# Patient Record
Sex: Male | Born: 1975 | Race: White | Hispanic: No | Marital: Single | Smoking: Current every day smoker
Health system: Southern US, Community
[De-identification: ages and names within clinical notes are randomized; demographics above are authoritative.]

## PROBLEM LIST (undated history)

## (undated) ENCOUNTER — Encounter

## (undated) ENCOUNTER — Telehealth

## (undated) ENCOUNTER — Inpatient Hospital Stay

## (undated) ENCOUNTER — Ambulatory Visit

## (undated) ENCOUNTER — Ambulatory Visit: Payer: Medicaid (Managed Care)

## (undated) ENCOUNTER — Telehealth
Attending: Pharmacist Clinician (PhC)/ Clinical Pharmacy Specialist | Primary: Pharmacist Clinician (PhC)/ Clinical Pharmacy Specialist

## (undated) ENCOUNTER — Ambulatory Visit: Payer: MEDICAID

## (undated) DIAGNOSIS — F1990 Other psychoactive substance use, unspecified, uncomplicated: Secondary | ICD-10-CM

## (undated) DIAGNOSIS — B1920 Unspecified viral hepatitis C without hepatic coma: Secondary | ICD-10-CM

## (undated) HISTORY — PX: ABDOMINAL SURGERY: SHX537

## (undated) HISTORY — PX: HERNIA REPAIR: SHX51

---

## 2006-03-01 ENCOUNTER — Emergency Department: Payer: Self-pay | Admitting: Emergency Medicine

## 2006-03-01 ENCOUNTER — Other Ambulatory Visit: Payer: Self-pay

## 2007-01-25 ENCOUNTER — Emergency Department: Payer: Self-pay

## 2008-06-18 ENCOUNTER — Emergency Department: Payer: Self-pay | Admitting: Emergency Medicine

## 2008-07-28 ENCOUNTER — Inpatient Hospital Stay: Payer: Self-pay | Admitting: Psychiatry

## 2008-10-29 ENCOUNTER — Emergency Department: Payer: Self-pay | Admitting: Emergency Medicine

## 2008-12-21 ENCOUNTER — Emergency Department: Payer: Self-pay | Admitting: Emergency Medicine

## 2009-01-09 ENCOUNTER — Emergency Department: Payer: Self-pay | Admitting: Emergency Medicine

## 2011-02-17 ENCOUNTER — Emergency Department: Payer: Self-pay | Admitting: Emergency Medicine

## 2011-04-14 ENCOUNTER — Emergency Department: Payer: Self-pay | Admitting: Emergency Medicine

## 2011-04-17 ENCOUNTER — Emergency Department: Payer: Self-pay | Admitting: Internal Medicine

## 2011-10-23 ENCOUNTER — Emergency Department: Payer: Self-pay | Admitting: *Deleted

## 2011-10-28 LAB — WOUND CULTURE

## 2012-01-02 ENCOUNTER — Emergency Department: Payer: Self-pay | Admitting: Emergency Medicine

## 2012-07-16 ENCOUNTER — Inpatient Hospital Stay: Payer: Self-pay | Admitting: Internal Medicine

## 2012-07-16 LAB — CBC
HCT: 44.4 % (ref 40.0–52.0)
HGB: 14.8 g/dL (ref 13.0–18.0)
MCH: 30.1 pg (ref 26.0–34.0)
MCV: 91 fL (ref 80–100)
Platelet: 182 10*3/uL (ref 150–440)
RDW: 13.8 % (ref 11.5–14.5)
WBC: 19.6 10*3/uL — ABNORMAL HIGH (ref 3.8–10.6)

## 2012-07-16 LAB — COMPREHENSIVE METABOLIC PANEL
Anion Gap: 3 — ABNORMAL LOW (ref 7–16)
BUN: 12 mg/dL (ref 7–18)
Bilirubin,Total: 0.6 mg/dL (ref 0.2–1.0)
Calcium, Total: 8.3 mg/dL — ABNORMAL LOW (ref 8.5–10.1)
Chloride: 105 mmol/L (ref 98–107)
Co2: 30 mmol/L (ref 21–32)
Creatinine: 1.04 mg/dL (ref 0.60–1.30)
EGFR (African American): >60
EGFR (Non-African Amer.): >60
Glucose: 138 mg/dL — ABNORMAL HIGH (ref 65–99)
Potassium: 3.9 mmol/L (ref 3.5–5.1)
SGOT(AST): 56 U/L — ABNORMAL HIGH (ref 15–37)
SGPT (ALT): 147 U/L — ABNORMAL HIGH (ref 12–78)
Sodium: 138 mmol/L (ref 136–145)
Total Protein: 8.2 g/dL (ref 6.4–8.2)

## 2012-07-16 LAB — ACETAMINOPHEN LEVEL: Acetaminophen: <2 ug/mL

## 2012-07-17 LAB — CBC WITH DIFFERENTIAL/PLATELET
Eosinophil #: 0.1 10*3/uL (ref 0.0–0.7)
HGB: 13.7 g/dL (ref 13.0–18.0)
Lymphocyte #: 3.3 10*3/uL (ref 1.0–3.6)
Lymphocyte %: 37 %
MCH: 31.4 pg (ref 26.0–34.0)
MCHC: 34.9 g/dL (ref 32.0–36.0)
Monocyte #: 1 x10 3/mm (ref 0.2–1.0)
Monocyte %: 11.1 %
Neutrophil #: 4.5 10*3/uL (ref 1.4–6.5)
Neutrophil %: 50.7 %
Platelet: 171 10*3/uL (ref 150–440)
RDW: 13.6 % (ref 11.5–14.5)
WBC: 9 10*3/uL (ref 3.8–10.6)

## 2012-07-22 LAB — CULTURE, BLOOD (SINGLE)

## 2012-08-05 ENCOUNTER — Emergency Department: Payer: Self-pay | Admitting: Internal Medicine

## 2012-10-13 ENCOUNTER — Emergency Department: Payer: Self-pay | Admitting: Emergency Medicine

## 2014-08-17 ENCOUNTER — Emergency Department: Payer: Self-pay | Admitting: Emergency Medicine

## 2014-10-14 NOTE — Discharge Summary (Signed)
PATIENT NAME:  Gregory Collier, Gregory Collier MR#:  [id number] DATE OF BIRTH:  [birth year]  DATE OF ADMISSION:  07/16/2012 DATE OF DISCHARGE:  07/17/2012  PRIMARY CARE PHYSICIAN: None.   DISCHARGE DIAGNOSES:  1.  Right lower lobe aspiration pneumonia.  2.  Acute respiratory failure.  3.  Percocet overdose for pain.   IMAGING STUDIES: Include a chest x-ray which showed right lower lobe pneumonia concerning for aspiration.   ADMITTING HISTORY AND PHYSICAL: Please see detailed H and P dictated by Dr. Juliene PinaMody on 07/16/2012. In brief, this 39 year old male patient with history of tobacco dependence, alcohol abuse, presented to the Emergency Room being unresponsive. The patient was found by EMS, responded to Narcan in the Emergency Room, was supposed to be discharged home but had acute respiratory failure with hypoxemia and was admitted to the hospitalist service after chest x-ray showed right lower lobe pneumonia.   HOSPITAL COURSE:  1.  Right lower lobe aspiration pneumonia. The patient was started on Zosyn while in the hospital and on the day of discharge transitioned to Augmentin at home for 6 more days. His saturations were 86% on room air at the time of discharge.  2.  Narcotic overdose. The patient had chronic pain in neck and back, for which he took extra 4 to 5 Percocet, did not have any depression or suicidal ideation.   DISCHARGE CONDITION: His lungs sound clear on the day of discharge with normal blood pressure and heart rate, and he is being discharged home in fair condition.   DISCHARGE MEDICATIONS: Include:  1.  Augmentin 875, 1 tablet oral 2 times a day for 6 days. 2.  Acetaminophen 650 mg oral every 4 hours as needed for pain and fever.   DISCHARGE INSTRUCTIONS: Regular diet. No restriction on activity. Follow up with primary care physician in a week. I have advised the patient to take medications only as prescribed to prevent further overdose.   TIME SPENT: On day of discharge in discharge  activity was 45 minutes.    ____________________________ Gregory BailiffSrikar R. Joquan Lotz, MD srs:jm D: 07/17/2012 12:50:04 ET Collier: 07/17/2012 15:11:57 ET JOB#: [id number]  cc: Wardell HeathSrikar R. Gina Costilla, MD, <Dictator> Orie FishermanSRIKAR R Chloey Ricard MD ELECTRONICALLY SIGNED 07/30/2012 13:21

## 2014-10-14 NOTE — H&P (Signed)
PATIENT NAME:  Gregory Collier, Gregory Collier MR#:  [id number] DATE OF BIRTH:  [birth year]  DATE OF ADMISSION:  07/16/2012  PRIMARY CARE PHYSICIAN: None.   CHIEF COMPLAINT: Overdose, accidental.   HISTORY OF PRESENT ILLNESS: This is a 39 year old male with history of tobacco dependence and alcohol abuse, who presents with the above complaint. The patient was brought in via EMS for unresponsiveness.  His girlfriend called EMS because he was unresponsive on the floor. He says that he was drinking four 40s last night and then took 4 to 5 Percocet due to back pain. He denies any suicidal or homicidal ideations or depression. In the Emergency Room, he was given Narcan. He is actually back to his baseline and he was he was going to be discharged; however, when he took his oxygen off  he de-sated into 80s, so the hospitalist services consulted for admission for aspiration pneumonia as evidenced by his chest x-ray.   REVIEW OF SYSTEMS: CONSTITUTIONAL: Denies any fever, fatigue, or weakness.  EYES:  No blurry or double vision.  ENT: No ear pain or hearing loss.  RESPIRATORY:  He denies cough, wheezing, history of COPD.  CARDIOVASCULAR: No chest pain or palpitations. He did have some unresponsiveness.  GASTROINTESTINAL: No nausea, vomiting, diarrhea, abdominal pain, or ulcers.  GENITOURINARY:  No dysuria or hematuria.  ENDOCRINE: No thyroid problems or increased sweating. HEMATOLOGIC/LYMPHATIC:  No anemia, easy bruising.   SKIN:  No rash or lesions.   MUSCULOSKELETAL: He has pain occasionally, but no limited activity.  NEUROLOGIC: No history of CVA, TIA or seizures.   PSYCH: No history of anxiety.   PAST MEDICAL HISTORY: 1.  History of tobacco dependence.  2.  History of hepatitis C.   MEDICATIONS: None.   SOCIAL HISTORY: The patient smokes 3 packs a day and is not interested at all quitting. He drinks 4 or 5 times a month in which he binge drinks.  ALLERGIES: THE PATIENT SAYS HE IS ALLERGIC TO SOME  ANTIBIOTIC, BUT HE JUST DOES NOT KNOW.   FAMILY HISTORY: Back and foot problems.   PAST SURGICAL HISTORY:   1.  Pyloric stenosis.  2.  Hernia repair.   PHYSICAL EXAMINATION: VITAL SIGNS: Temperature 98.4, pulse 106, respirations 20, blood pressure 109/75 and 93% on 2 liters.  GENERAL: The patient is alert, oriented, not in acute distress.  HEENT: Head is atraumatic. Pupils are round and reactive. Sclerae anicteric. Mucous membranes are moist. Oropharynx is clear.  NECK: Supple without JVD or carotid bruit or enlarged thyroid.  CARDIOVASCULAR: Regular rate and rhythm. No murmurs, gallops, or rubs. PMI is not displaced.  LUNGS: Clear to auscultation. There are no obvious crackles, rales, rhonchi, or wheezing. No increased respiratory effort.  ABDOMEN: Bowel sounds are positive, nontender, nondistended. No hepatosplenomegaly.   EXTREMITIES: No clubbing, cyanosis, or edema.  NEUROLOGIC:  Cranial nerves 2 through 12 are intact. There are no focal deficits.  SKIN: Without rash or lesions.   LABORATORY DATA: White blood cells 19.6, hemoglobin 14.8, hematocrit 44.4, platelets 182, sodium 138, potassium 3.9, chloride 105, bicarbonate 30, BUN 12, creatinine 1.04, glucose 138, calcium 8.3, bilirubin 0.6, alkaline phosphatase 62, ALT 147, AST 56, total protein 8.2, albumin 4.3.  Chest x-ray shows right lung base pneumonia.   EKG is normal sinus rhythm. No ST elevations or depressions.   ASSESSMENT AND PLAN: A 39 year old male who took 4 to 5 Percocet while drinking a significant amount of alcohol, who presented with unresponsiveness and was found to have an aspiration pneumonia  with hypoxia.  1.  Acute respiratory failure with hypoxia, secondary to aspiration pneumonia. The patient will be treated for aspiration pneumonia with Zosyn. We will monitor closely as the patient says he is allergic to an antibiotic, but he does not know the reaction or what antibiotic he is allergic to. Blood cultures were  ordered in the Emergency Room; however, apparently there is a short supply/of blood cultures in the hospital, so I am not quite sure at this time if they are actually taken or not. We will follow up.  2.  Systemic inflammatory response syndrome, as evidenced by leukocytosis and tachycardia. We will monitor. This is all due to aspiration pneumonia.  3.  Overdose, accidental, unintentional. The patient will be monitored. The patient denies any suicidal ideations. At this time, I do not think we need a sitter or psychiatric consult.  4.  Tobacco dependence. The patient is absolutely adamant about not quitting smoking. We will place a nicotine patch.   CODE STATUS:  The patient is full CODE STATUS.   The patient will need a primary care physician prior to discharge.   TIME SPENT: Approximately 35 minutes.    ____________________________ Janyth Contes. Juliene Pina, MD spm:cc D: 07/16/2012 17:22:24 ET Collier: 07/16/2012 18:04:40 ET JOB#: [id number]  cc: Glenora Morocho P. Juliene Pina, MD, <Dictator> Janyth Contes Odarius Dines MD ELECTRONICALLY SIGNED 07/17/2012 13:47

## 2015-04-22 ENCOUNTER — Emergency Department
Admission: EM | Admit: 2015-04-22 | Discharge: 2015-04-23 | Disposition: A | Discharge status: Home / Self Care | Payer: Self-pay | Attending: Emergency Medicine | Admitting: Emergency Medicine

## 2015-04-22 ENCOUNTER — Encounter: Payer: Self-pay | Admitting: Emergency Medicine

## 2015-04-22 DIAGNOSIS — M546 Pain in thoracic spine: Secondary | ICD-10-CM | POA: Insufficient documentation

## 2015-04-22 DIAGNOSIS — M545 Low back pain, unspecified: Secondary | ICD-10-CM

## 2015-04-22 DIAGNOSIS — F17210 Nicotine dependence, cigarettes, uncomplicated: Secondary | ICD-10-CM | POA: Insufficient documentation

## 2015-04-22 DIAGNOSIS — R110 Nausea: Secondary | ICD-10-CM | POA: Insufficient documentation

## 2015-04-22 DIAGNOSIS — R509 Fever, unspecified: Secondary | ICD-10-CM | POA: Insufficient documentation

## 2015-04-22 DIAGNOSIS — R109 Unspecified abdominal pain: Secondary | ICD-10-CM | POA: Insufficient documentation

## 2015-04-22 DIAGNOSIS — R10A1 Flank pain, right side: Secondary | ICD-10-CM

## 2015-04-22 LAB — URINALYSIS COMPLETE WITH MICROSCOPIC (ARMC ONLY)
BILIRUBIN URINE: NEGATIVE
Bacteria, UA: NONE SEEN
GLUCOSE, UA: NEGATIVE mg/dL
Hgb urine dipstick: NEGATIVE
KETONES UR: NEGATIVE mg/dL
LEUKOCYTES UA: NEGATIVE
NITRITE: NEGATIVE
PH: 6 (ref 5.0–8.0)
Protein, ur: NEGATIVE mg/dL
Specific Gravity, Urine: 1.021 (ref 1.005–1.030)
Squamous Epithelial / LPF: NONE SEEN

## 2015-04-22 NOTE — ED Notes (Signed)
Pt c/o right low back pain for 3 weeks; denies injury but pain is worse with movement; says urine is dark but denies any urinary s/s;

## 2015-04-23 ENCOUNTER — Emergency Department: Payer: Self-pay

## 2015-04-23 LAB — CBC WITH DIFFERENTIAL/PLATELET
BASOS ABS: 0 10*3/uL (ref 0–0.1)
BASOS PCT: 1 %
EOS PCT: 11 %
Eosinophils Absolute: 0.8 10*3/uL — ABNORMAL HIGH (ref 0–0.7)
HEMATOCRIT: 40.6 % (ref 40.0–52.0)
Hemoglobin: 13.6 g/dL (ref 13.0–18.0)
Lymphocytes Relative: 39 %
Lymphs Abs: 2.9 10*3/uL (ref 1.0–3.6)
MCH: 30.6 pg (ref 26.0–34.0)
MCHC: 33.6 g/dL (ref 32.0–36.0)
MCV: 91.1 fL (ref 80.0–100.0)
MONO ABS: 0.7 10*3/uL (ref 0.2–1.0)
MONOS PCT: 9 %
NEUTROS ABS: 3 10*3/uL (ref 1.4–6.5)
Neutrophils Relative %: 40 %
PLATELETS: 211 10*3/uL (ref 150–440)
RBC: 4.45 MIL/uL (ref 4.40–5.90)
RDW: 12.7 % (ref 11.5–14.5)
WBC: 7.4 10*3/uL (ref 3.8–10.6)

## 2015-04-23 LAB — COMPREHENSIVE METABOLIC PANEL
ALBUMIN: 3.5 g/dL (ref 3.5–5.0)
ALT: 47 U/L (ref 17–63)
ANION GAP: 7 (ref 5–15)
AST: 34 U/L (ref 15–41)
Alkaline Phosphatase: 33 U/L — ABNORMAL LOW (ref 38–126)
BILIRUBIN TOTAL: 0.1 mg/dL — AB (ref 0.3–1.2)
BUN: 21 mg/dL — ABNORMAL HIGH (ref 6–20)
CALCIUM: 8.9 mg/dL (ref 8.9–10.3)
CHLORIDE: 105 mmol/L (ref 101–111)
CO2: 26 mmol/L (ref 22–32)
Creatinine, Ser: 1.02 mg/dL (ref 0.61–1.24)
GFR calc Af Amer: >60 mL/min (ref >60)
GLUCOSE: 99 mg/dL (ref 65–99)
POTASSIUM: 3.9 mmol/L (ref 3.5–5.1)
Sodium: 138 mmol/L (ref 135–145)
Total Protein: 6.4 g/dL — ABNORMAL LOW (ref 6.5–8.1)

## 2015-04-23 LAB — LIPASE, BLOOD: LIPASE: 27 U/L (ref 11–51)

## 2015-04-23 MED ORDER — ONDANSETRON HCL 4 MG/2ML IJ SOLN
4.0000 mg | Freq: Once | INTRAMUSCULAR | Status: AC
  Administered 2015-04-23: 4 mg via INTRAVENOUS
  Filled 2015-04-23: qty 2

## 2015-04-23 MED ORDER — CYCLOBENZAPRINE HCL 10 MG PO TABS: 10.0000 mg | ORAL_TABLET | Freq: Three times a day (TID) | ORAL | Status: DC | PRN

## 2015-04-23 MED ORDER — CARISOPRODOL 350 MG PO TABS
350.0000 mg | ORAL_TABLET | Freq: Three times a day (TID) | ORAL | Status: AC | PRN
Start: 2015-04-23 — End: ?

## 2015-04-23 MED ORDER — MORPHINE SULFATE (PF) 4 MG/ML IV SOLN
4.0000 mg | Freq: Once | INTRAVENOUS | Status: AC
  Administered 2015-04-23: 4 mg via INTRAVENOUS
  Filled 2015-04-23: qty 1

## 2015-04-23 MED ORDER — SODIUM CHLORIDE 0.9 % IV BOLUS (SEPSIS)
1000.0000 mL | Freq: Once | INTRAVENOUS | Status: AC
  Administered 2015-04-23: 1000 mL via INTRAVENOUS

## 2015-04-23 NOTE — ED Notes (Signed)
MD Scheavitz at bedside. 

## 2015-04-23 NOTE — ED Provider Notes (Signed)
[facility] Emergency Department Provider Note  ____________________________________________  Time seen: Approximately 1225 AM  I have reviewed the triage vital signs and the nursing notes.   HISTORY  Chief Complaint Back Pain    HPI Gregory Collier is a 39 y.o. male without any pertinent medical history was presenting today with right flank and right back pain which is in the thoracic as well as lumbar region. He says it has been constant over the past 3 weeks. He says that it is associated with subjective fever as well as nausea. He says that his urine has been dark. He denies any burning or frequency. He describes the pain as sharp. He has tried ibuprofen at home without relief.Denies any drug use. Denies any weakness to lower extremities. Says that he works in [employer] and does heavy lifting but has been doing this job for 20 years now.   History reviewed. No pertinent past medical history.  There are no active problems to display for this patient.   Past Surgical History  Procedure Laterality Date  . Hernia repair    . Abdominal surgery      No current outpatient prescriptions on file.  Allergies Review of patient's allergies indicates no known allergies.  History reviewed. No pertinent family history.  Social History Social History  Substance Use Topics  . Smoking status: Current Every Day Smoker -- 2.00 packs/day for 20 years    Types: Cigarettes  . Smokeless tobacco: Never Used  . Alcohol Use: No     Comment: 1 beer a month    Review of Systems Constitutional: Subjective fever Eyes: No visual changes. ENT: No sore throat. Cardiovascular: Denies chest pain. Respiratory: Denies shortness of breath. Gastrointestinal: No abdominal pain.   no vomiting.  No diarrhea.  No constipation. Genitourinary: Negative for dysuria. Musculoskeletal: As above  Skin: Negative for rash. Neurological: Negative for headaches, focal weakness or  numbness.  10-point ROS otherwise negative.  ____________________________________________   PHYSICAL EXAM:  VITAL SIGNS: ED Triage Vitals  Enc Vitals Group     BP 04/22/15 2147 119/66 mmHg     Pulse Rate 04/22/15 2147 60     Resp 04/22/15 2147 18     Temp 04/22/15 2147 98.4 F (36.9 C)     Temp Source 04/22/15 2147 Oral     SpO2 04/22/15 2147 96 %     Weight 04/22/15 2147 165 lb (74.844 kg)     Height 04/22/15 2147 5\' 10"  (1.778 m)     Head Cir --      Peak Flow --      Pain Score 04/22/15 2147 8     Pain Loc --      Pain Edu? --      Excl. in GC? --     Constitutional: Alert and oriented. Well appearing and in no acute distress. Eyes: Conjunctivae are normal. PERRL. EOMI. Head: Atraumatic. Nose: No congestion/rhinnorhea. Mouth/Throat: Mucous membranes are moist.  Oropharynx non-erythematous. Neck: No stridor.   Cardiovascular: Normal rate, regular rhythm. Grossly normal heart sounds.  Good peripheral circulation. Respiratory: Normal respiratory effort.  No retractions. Lungs CTAB. Gastrointestinal: Soft and nontender. No distention. No abdominal bruits. Right-sided CVA and right lumbar tenderness to palpation. There is no midline spinal tenderness, step-off or deformity. Musculoskeletal: No lower extremity tenderness nor edema.  No joint effusions. Neurologic:  Normal speech and language. No gross focal neurologic deficits are appreciated. No gait instability. Skin:  Skin is warm, dry and intact. No  rash noted. Psychiatric: Mood and affect are normal. Speech and behavior are normal.  ____________________________________________   LABS (all labs ordered are listed, but only abnormal results are displayed)  Labs Reviewed  URINALYSIS COMPLETEWITH MICROSCOPIC ([facility] ONLY) - Abnormal; Notable for the following:    Color, Urine YELLOW (*)    APPearance CLEAR (*)    All other components within normal limits  CBC WITH DIFFERENTIAL/PLATELET - Abnormal; Notable for the  following:    Eosinophils Absolute 0.8 (*)    All other components within normal limits  COMPREHENSIVE METABOLIC PANEL - Abnormal; Notable for the following:    BUN 21 (*)    Total Protein 6.4 (*)    Alkaline Phosphatase 33 (*)    Total Bilirubin 0.1 (*)    All other components within normal limits  LIPASE, BLOOD   ____________________________________________  EKG   ____________________________________________  RADIOLOGY  No cause for the patient's pain found on the cat scan of the abdomen and pelvis. ____________________________________________   PROCEDURES  ____________________________________________   INITIAL IMPRESSION / ASSESSMENT AND PLAN / ED COURSE  Pertinent labs & imaging results that were available during my care of the patient were reviewed by me and considered in my medical decision making (see chart for details).  ----------------------------------------- 2:53 AM on 04/23/2015 -----------------------------------------  Patient is resting comfortably at this time. Updated the patient as well as his family member about the results of the blood work as well as the CAT scan. Possibly musculoskeletal pain does not appear to be an emergent condition requiring further workup or treatment in the emergency department at this time. Patient says he has been using Aspercreme and I see hot at home. Has a heating pad at home will try that as well. We'll continue to use ibuprofen. We'll also discharge with a muscle relaxer. Will give follow-up with primary care doctor that the patient may call to schedule follow-up appointment. ____________________________________________   FINAL CLINICAL IMPRESSION(S) / ED DIAGNOSES  Final diagnoses:  Right flank pain      Myrna Blazer, MD 04/23/15 [phone]

## 2015-04-23 NOTE — ED Notes (Signed)
Pt returned from ct at this time

## 2015-04-23 NOTE — ED Notes (Signed)
Pt ambulatory to bathroom at this time with no concerns. Pt in no acute distress. Vitals wnl.

## 2015-04-23 NOTE — ED Notes (Signed)
Pt requesting to "go out outside to get something from my car" Pt made aware that cannot leave due to having IV in arm. Pt stating "i will be right back" Pt further made aware that cannot go outside with IV. Pt went back into room at this time, laying in bed, no acute distress noted.

## 2015-04-23 NOTE — ED Notes (Signed)
Patient transported to CT 

## 2016-01-17 ENCOUNTER — Emergency Department (HOSPITAL_COMMUNITY)
Admission: EM | Admit: 2016-01-17 | Discharge: 2016-01-17 | Disposition: A | Discharge status: Home / Self Care | Payer: Medicaid Other | Attending: Emergency Medicine | Admitting: Emergency Medicine

## 2016-01-17 ENCOUNTER — Encounter (HOSPITAL_COMMUNITY): Payer: Self-pay | Admitting: Emergency Medicine

## 2016-01-17 DIAGNOSIS — L0231 Cutaneous abscess of buttock: Secondary | ICD-10-CM | POA: Diagnosis not present

## 2016-01-17 DIAGNOSIS — L0291 Cutaneous abscess, unspecified: Secondary | ICD-10-CM

## 2016-01-17 DIAGNOSIS — Z79899 Other long term (current) drug therapy: Secondary | ICD-10-CM | POA: Diagnosis not present

## 2016-01-17 DIAGNOSIS — L02415 Cutaneous abscess of right lower limb: Secondary | ICD-10-CM | POA: Insufficient documentation

## 2016-01-17 DIAGNOSIS — L0390 Cellulitis, unspecified: Secondary | ICD-10-CM

## 2016-01-17 DIAGNOSIS — F17210 Nicotine dependence, cigarettes, uncomplicated: Secondary | ICD-10-CM | POA: Insufficient documentation

## 2016-01-17 HISTORY — DX: Unspecified viral hepatitis C without hepatic coma: B19.20

## 2016-01-17 MED ORDER — SULFAMETHOXAZOLE-TRIMETHOPRIM 800-160 MG PO TABS: 1.0000 | ORAL_TABLET | Freq: Two times a day (BID) | ORAL | 0 refills | Status: AC

## 2016-01-17 MED ORDER — MELOXICAM 15 MG PO TABS: 15.0000 mg | ORAL_TABLET | Freq: Every day | ORAL | 0 refills | Status: AC

## 2016-01-17 MED ORDER — LIDOCAINE-EPINEPHRINE (PF) 2 %-1:200000 IJ SOLN
10.0000 mL | Freq: Once | INTRAMUSCULAR | Status: AC
  Administered 2016-01-17: 10 mL via INTRADERMAL
  Filled 2016-01-17: qty 20

## 2016-01-17 MED ORDER — KETOROLAC TROMETHAMINE 60 MG/2ML IM SOLN
60.0000 mg | Freq: Once | INTRAMUSCULAR | Status: AC
  Administered 2016-01-17: 60 mg via INTRAMUSCULAR
  Filled 2016-01-17: qty 2

## 2016-01-17 NOTE — ED Notes (Signed)
Pt verbalized understanding of d/c instructions and has no further questions. Pt stable and NAD. Pt provided with bus pass.

## 2016-01-17 NOTE — ED Triage Notes (Signed)
Pt. reports multiple skin abscesses at left buttocks  And bilateral knee with drainage onset 2 weeks ago.

## 2016-01-17 NOTE — ED Provider Notes (Signed)
[facility]-EMERGENCY DEPT Provider Note   CSN: [id number] Arrival date & time: 01/17/16  0027  First Provider Contact:  None       History   Chief Complaint Chief Complaint  Patient presents with  . Abscess    HPI Gregory Collier is a 40 y.o. male.  40 year old male who presents with multiple abscesses. PMH significant for abscess formation, past IVDU, Hep C. He reports last time he used IV drugs was 8 months ago. He states the abscesses have been forming over the past 2 weeks. He believes he was bit by a spider however did not actually see a spider. The first one appeared behind his right knee which he opened himself with a razor and took old antibiotics from when he had a previous abscess. He developed another abscess on his right anterior thigh just above his knee and left buttock which he also drained himself today and reports a copious amount of pus was drained. He also has a swollen, red, painful left knee which he tried to drain but no fluid came out. Reports associated difficulty walking and sitting due to pain. He denies fever.       Past Medical History:  Diagnosis Date  . Hepatitis C     There are no active problems to display for this patient.   Past Surgical History:  Procedure Laterality Date  . ABDOMINAL SURGERY    . HERNIA REPAIR         Home Medications    Prior to Admission medications   Medication Sig Start Date End Date Taking? Authorizing Provider  carisoprodol (SOMA) 350 MG tablet Take 1 tablet (350 mg total) by mouth 3 (three) times daily as needed for muscle spasms. 04/23/15   Myrna Blazer, MD  meloxicam (MOBIC) 15 MG tablet Take 1 tablet (15 mg total) by mouth daily. 01/17/16   Bethel Born, PA-C  sulfamethoxazole-trimethoprim (BACTRIM DS,SEPTRA DS) 800-160 MG tablet Take 1 tablet by mouth 2 (two) times daily. 01/17/16 01/24/16  Bethel Born, PA-C    Family History No family history on file.  Social History Social History    Substance Use Topics  . Smoking status: Current Every Day Smoker    Packs/day: 0.00    Years: 0.00    Types: Cigarettes  . Smokeless tobacco: Never Used  . Alcohol use No     Allergies   Review of patient's allergies indicates no known allergies.   Review of Systems Review of Systems  Constitutional: Negative for fever.  Musculoskeletal: Positive for gait problem and joint swelling.  Skin:       Abscesses     Physical Exam Updated Vital Signs BP 149/90 (BP Location: Left Arm)   Pulse 92   Temp 99.4 F (37.4 C) (Oral)   Resp 16   Ht 5\' 10"  (1.778 m)   Wt 67.6 kg   SpO2 98%   BMI 21.38 kg/m   Physical Exam  Constitutional: He is oriented to person, place, and time. He appears well-developed and well-nourished.  HENT:  Head: Normocephalic and atraumatic.  Eyes: Conjunctivae are normal.  Neck: Neck supple.  Cardiovascular: Normal rate and regular rhythm.   No murmur heard. Pulmonary/Chest: Effort normal and breath sounds normal. No respiratory distress.  Abdominal: Soft. There is no tenderness.  Musculoskeletal: He exhibits no edema.  Right knee: There is obvious swelling and redness. No deformity. Moderate to severe tenderness to palpation of anterior knee. Decreased flexion due to pain  and swelling. Normal extension. US shows diffuse cellulitis but not evidence of fluid collection   Neurological: He is alert and oriented to person, place, and time.  Skin: Skin is warm and dry.  Well healing abscess behind right knee in popliteal fossa. Inflamed, indurated abscess on right anterior thigh with a central scab from previous incision. Inflamed, indurated abscess on left buttock with minimal drainage from previous incision.  Psychiatric: He has a normal mood and affect.  Nursing note and vitals reviewed.    ED Treatments / Results  Labs (all labs ordered are listed, but only abnormal results are displayed) Labs Reviewed  AEROBIC CULTURE (SUPERFICIAL SPECIMEN)    Procedures Procedures (including critical care time)  INCISION AND DRAINAGE Performed by: Bethel Born Consent: Verbal consent obtained. Risks and benefits: risks, benefits and alternatives were discussed Type: abscess  Body area: right anterior thigh  Anesthesia: local infiltration  Incision was made with a scalpel.  Local anesthetic: lidocaine 2% with epinephrine  Anesthetic total: 5 ml  Complexity: Simple  Blunt dissection to break up loculations  Drainage: Bloody  Drainage amount: <1cc  Packing material: None  Patient tolerance: Patient tolerated the procedure well with no immediate complications.   INCISION AND DRAINAGE Performed by: Bethel Born Consent: Verbal consent obtained. Risks and benefits: risks, benefits and alternatives were discussed Type: abscess  Body area: Left buttock  Anesthesia: local infiltration  Incision was made with a scalpel.  Local anesthetic: lidocaine 2% with epinephrine  Anesthetic total: 5 ml  Complexity: simple Blunt dissection to break up loculations  Drainage: bloody  Drainage amount: <1cc  Packing material: none  Patient tolerance: Patient tolerated the procedure well with no immediate complications.   Medications Ordered in ED Medications  ketorolac (TORADOL) injection 60 mg (60 mg Intramuscular Given 01/17/16 0130)  lidocaine-EPINEPHrine (XYLOCAINE W/EPI) 2 %-1:200000 (PF) injection 10 mL (10 mLs Intradermal Given 01/17/16 0135)     Initial Impression / Assessment and Plan / ED Course  I have reviewed the triage vital signs and the nursing notes.  Pertinent labs & imaging results that were available during my care of the patient were reviewed by me and considered in my medical decision making (see chart for details).  Clinical Course   40 year old male presents with multiple abscesses. He has a mildly elevated temp but otherwise vital signs are WNL. Shared visit with Dr. Rhunette Croft. Left knee  appears to be cellulitis and is less concerning for septic arthritis. US exam shows cellulitis over left knee and small amount of fluid collection over right thigh. I&D performed for right thigh and left buttock. Patient tolerated well. Abscesses were irrigated and dressed. Will d/c with Bactrim and NSAIDs. Patient is NAD, non-toxic, with stable VS. Patient is informed of clinical course, understands medical decision making process, and agrees with plan. Opportunity for questions provided and all questions answered. Return precautions given.   Final Clinical Impressions(s) / ED Diagnoses   Final diagnoses:  Abscess and cellulitis    New Prescriptions New Prescriptions   MELOXICAM (MOBIC) 15 MG TABLET    Take 1 tablet (15 mg total) by mouth daily.   SULFAMETHOXAZOLE-TRIMETHOPRIM (BACTRIM DS,SEPTRA DS) 800-160 MG TABLET    Take 1 tablet by mouth 2 (two) times daily.     Bethel Born, PA-C 01/17/16 1216    Derwood Kaplan, MD 01/18/16 [phone]

## 2016-01-17 NOTE — ED Notes (Signed)
I&D tray placed at bedside for PA

## 2016-01-19 LAB — AEROBIC CULTURE  (SUPERFICIAL SPECIMEN)

## 2016-01-19 LAB — AEROBIC CULTURE W GRAM STAIN (SUPERFICIAL SPECIMEN)

## 2016-01-20 ENCOUNTER — Telehealth (HOSPITAL_BASED_OUTPATIENT_CLINIC_OR_DEPARTMENT_OTHER): Payer: Self-pay

## 2016-01-20 NOTE — Telephone Encounter (Signed)
Post ED Visit - Positive Culture Follow-up  Culture report reviewed by antimicrobial stewardship pharmacist:  []  Enzo Bi, Pharm.D. []  Celedonio Miyamoto, [address].D., BCPS []  Garvin Fila, Pharm.D. []  Georgina Pillion, Pharm.D., BCPS []  [place], [address].D., BCPS, AAHIVP []  Estella Husk, Pharm.D., BCPS, AAHIVP []  Tennis Must, Pharm.D. []  Sherle Poe, [place].D. Revonda Standard Masters Pharm D Positive aerobic culture superficial culture Treated with Sulfamethoxazole-Trimethoprim  organism sensitive to the same and no further patient follow-up is required at this time.  Jerry Caras 01/20/2016, 9:41 AM

## 2016-02-01 ENCOUNTER — Emergency Department: Payer: Medicaid Other

## 2016-02-01 DIAGNOSIS — F1120 Opioid dependence, uncomplicated: Secondary | ICD-10-CM | POA: Insufficient documentation

## 2016-02-01 DIAGNOSIS — Z9889 Other specified postprocedural states: Secondary | ICD-10-CM | POA: Insufficient documentation

## 2016-02-01 DIAGNOSIS — F17210 Nicotine dependence, cigarettes, uncomplicated: Secondary | ICD-10-CM | POA: Insufficient documentation

## 2016-02-01 DIAGNOSIS — M25562 Pain in left knee: Secondary | ICD-10-CM | POA: Diagnosis not present

## 2016-02-01 DIAGNOSIS — B1920 Unspecified viral hepatitis C without hepatic coma: Principal | ICD-10-CM | POA: Insufficient documentation

## 2016-02-01 DIAGNOSIS — M009 Pyogenic arthritis, unspecified: Secondary | ICD-10-CM | POA: Diagnosis present

## 2016-02-01 LAB — COMPREHENSIVE METABOLIC PANEL
ALBUMIN: 3.6 g/dL (ref 3.5–5.0)
ALT: 25 U/L (ref 17–63)
ANION GAP: 7 (ref 5–15)
AST: 25 U/L (ref 15–41)
Alkaline Phosphatase: 56 U/L (ref 38–126)
BUN: 14 mg/dL (ref 6–20)
CHLORIDE: 103 mmol/L (ref 101–111)
CO2: 26 mmol/L (ref 22–32)
Calcium: 8.8 mg/dL — ABNORMAL LOW (ref 8.9–10.3)
Creatinine, Ser: 0.79 mg/dL (ref 0.61–1.24)
GFR calc Af Amer: >60 mL/min (ref >60)
GFR calc non Af Amer: >60 mL/min (ref >60)
GLUCOSE: 125 mg/dL — AB (ref 65–99)
POTASSIUM: 4.2 mmol/L (ref 3.5–5.1)
SODIUM: 136 mmol/L (ref 135–145)
Total Bilirubin: 0.6 mg/dL (ref 0.3–1.2)
Total Protein: 7.7 g/dL (ref 6.5–8.1)

## 2016-02-01 LAB — CBC
HCT: 42.9 % (ref 40.0–52.0)
Hemoglobin: 14.6 g/dL (ref 13.0–18.0)
MCH: 30.1 pg (ref 26.0–34.0)
MCHC: 34 g/dL (ref 32.0–36.0)
MCV: 88.5 fL (ref 80.0–100.0)
Platelets: 313 10*3/uL (ref 150–440)
RBC: 4.85 MIL/uL (ref 4.40–5.90)
RDW: 14.5 % (ref 11.5–14.5)
WBC: 14.8 10*3/uL — ABNORMAL HIGH (ref 3.8–10.6)

## 2016-02-01 MED ORDER — VANCOMYCIN HCL IN DEXTROSE 1-5 GM/200ML-% IV SOLN
1000.0000 mg | Freq: Once | INTRAVENOUS | Status: DC
  Filled 2016-02-01: qty 200

## 2016-02-01 NOTE — ED Triage Notes (Addendum)
Pt in with co left knee pain for 1 week denies any injury. States now cannot walk due to pain, denies any hx of the same. Pt with large amt of swelling noted, wound noted where patient tried to drain it using a knife at home.

## 2016-02-02 ENCOUNTER — Observation Stay
Admission: EM | Admit: 2016-02-02 | Discharge: 2016-02-03 | Disposition: A | Discharge status: Home / Self Care | Payer: Medicaid Other | Attending: Internal Medicine | Admitting: Internal Medicine

## 2016-02-02 ENCOUNTER — Emergency Department: Payer: Medicaid Other

## 2016-02-02 DIAGNOSIS — M009 Pyogenic arthritis, unspecified: Secondary | ICD-10-CM | POA: Diagnosis present

## 2016-02-02 LAB — SYNOVIAL CELL COUNT + DIFF, W/ CRYSTALS
CRYSTALS FLUID: NONE SEEN
Eosinophils-Synovial: 0 %
Lymphocytes-Synovial Fld: 11 %
MONOCYTE-MACROPHAGE-SYNOVIAL FLUID: 0 %
Neutrophil, Synovial: 89 %
Other Cells-SYN: 0
WBC, SYNOVIAL: 0 /mm3 (ref 0–200)

## 2016-02-02 LAB — CHLAMYDIA/NGC RT PCR (ARMC ONLY)
Chlamydia Tr: NOT DETECTED
N gonorrhoeae: NOT DETECTED

## 2016-02-02 LAB — SEDIMENTATION RATE: SED RATE: 17 mm/h — AB (ref 0–15)

## 2016-02-02 LAB — RAPID HIV SCREEN (HIV 1/2 AB+AG)
HIV 1/2 Antibodies: NONREACTIVE
HIV-1 P24 Antigen - HIV24: NONREACTIVE

## 2016-02-02 LAB — LACTIC ACID, PLASMA
Lactic Acid, Venous: 1.2 mmol/L (ref 0.5–1.9)
Lactic Acid, Venous: 1.1 mmol/L (ref 0.5–1.9)

## 2016-02-02 LAB — C-REACTIVE PROTEIN: CRP: 7.4 mg/dL — AB (ref <1.0)

## 2016-02-02 LAB — HEMOGLOBIN A1C: Hgb A1c MFr Bld: 5.7 % (ref 4.0–6.0)

## 2016-02-02 LAB — TSH: TSH: 0.812 u[IU]/mL (ref 0.350–4.500)

## 2016-02-02 MED ORDER — HYDROCODONE-ACETAMINOPHEN 5-325 MG PO TABS: 1.0000 | ORAL_TABLET | ORAL | Status: DC | PRN

## 2016-02-02 MED ORDER — OXYCODONE HCL 5 MG PO TABS
10.0000 mg | ORAL_TABLET | ORAL | Status: DC | PRN
  Administered 2016-02-02 – 2016-02-03 (×5): 10 mg via ORAL
  Filled 2016-02-02 (×5): qty 2

## 2016-02-02 MED ORDER — ONDANSETRON HCL 4 MG/2ML IJ SOLN: 4.0000 mg | Freq: Four times a day (QID) | INTRAMUSCULAR | Status: DC | PRN

## 2016-02-02 MED ORDER — PIPERACILLIN-TAZOBACTAM 3.375 G IVPB
3.3750 g | Freq: Three times a day (TID) | INTRAVENOUS | Status: DC
  Filled 2016-02-02 (×3): qty 50

## 2016-02-02 MED ORDER — ONDANSETRON HCL 4 MG PO TABS: 4.0000 mg | ORAL_TABLET | Freq: Four times a day (QID) | ORAL | Status: DC | PRN

## 2016-02-02 MED ORDER — PIPERACILLIN-TAZOBACTAM 3.375 G IVPB 30 MIN
3.3750 g | Freq: Once | INTRAVENOUS | Status: AC
  Administered 2016-02-02: 3.375 g via INTRAVENOUS
  Filled 2016-02-02: qty 50

## 2016-02-02 MED ORDER — DEXTROSE 5 % IV SOLN
2.0000 g | Freq: Three times a day (TID) | INTRAVENOUS | Status: DC
  Administered 2016-02-02 – 2016-02-03 (×3): 2 g via INTRAVENOUS
  Filled 2016-02-02 (×5): qty 2

## 2016-02-02 MED ORDER — DOCUSATE SODIUM 100 MG PO CAPS
100.0000 mg | ORAL_CAPSULE | Freq: Two times a day (BID) | ORAL | Status: DC
  Administered 2016-02-02 (×2): 100 mg via ORAL
  Filled 2016-02-02 (×3): qty 1

## 2016-02-02 MED ORDER — SODIUM CHLORIDE 0.9 % IV BOLUS (SEPSIS)
1000.0000 mL | Freq: Once | INTRAVENOUS | Status: AC
  Administered 2016-02-02: 1000 mL via INTRAVENOUS

## 2016-02-02 MED ORDER — SODIUM CHLORIDE 0.9 % IV SOLN
1500.0000 mg | Freq: Once | INTRAVENOUS | Status: AC
  Administered 2016-02-02: 1500 mg via INTRAVENOUS
  Filled 2016-02-02: qty 1500

## 2016-02-02 MED ORDER — ACETAMINOPHEN 650 MG RE SUPP: 650.0000 mg | Freq: Four times a day (QID) | RECTAL | Status: DC | PRN

## 2016-02-02 MED ORDER — MORPHINE SULFATE (PF) 4 MG/ML IV SOLN
4.0000 mg | Freq: Once | INTRAVENOUS | Status: AC
  Administered 2016-02-02: 4 mg via INTRAVENOUS
  Filled 2016-02-02: qty 1

## 2016-02-02 MED ORDER — HYDROMORPHONE HCL 1 MG/ML IJ SOLN
1.0000 mg | INTRAMUSCULAR | Status: DC | PRN
  Administered 2016-02-02 – 2016-02-03 (×5): 1 mg via INTRAVENOUS
  Filled 2016-02-02 (×5): qty 1

## 2016-02-02 MED ORDER — VANCOMYCIN HCL IN DEXTROSE 1-5 GM/200ML-% IV SOLN
1000.0000 mg | Freq: Three times a day (TID) | INTRAVENOUS | Status: DC
  Administered 2016-02-02 – 2016-02-03 (×3): 1000 mg via INTRAVENOUS
  Filled 2016-02-02 (×5): qty 200

## 2016-02-02 MED ORDER — ACETAMINOPHEN 325 MG PO TABS: 650.0000 mg | ORAL_TABLET | Freq: Four times a day (QID) | ORAL | Status: DC | PRN

## 2016-02-02 NOTE — ED Notes (Signed)
Urine sample requested from patient. He is refusing at this time stating that he has already given a urine sample.

## 2016-02-02 NOTE — Care Management Note (Signed)
Case Management Note  Patient Details  Name: Gregory Collier MRN: [id number] Date of Birth: 20-Oct-1975  Subjective/Objective:     Spoke with patient who is alert but volunteers little information. 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 Patient is on Medicaid and stated [facility]. His PCP is doctor Nylan. Patient stated that he lives with his girlfriend and that he depends on friends for transportation.               Action/Plan: Home with self care.    Expected Discharge Date:                  Expected Discharge Plan:  Home/Self Care  In-House Referral:     Discharge planning Services  CM Consult  Post Acute Care Choice:    Choice offered to:     DME Arranged:    DME Agency:     HH Arranged:    HH Agency:     Status of Service:  In process, will continue to follow  If discussed at Long Length of Stay Meetings, dates discussed:    Additional Comments:  Gregory HugueninBerkhead, Gregory Cassetta L, RN 02/02/2016, 3:48 PM

## 2016-02-02 NOTE — Progress Notes (Signed)
Pharmacy Antibiotic Note  Gregory Collier is a 40 y.o. male admitted on 02/02/2016 with suspected septic arthritis, active IVDU.  Pharmacy has been consulted for vancomycin and ceftazidime dosing.  Received vancomycin 1500mg  IV x 1 and zosyn 3.375gm x 1 in ED  Plan: Vancomycin 1gm IV Q8H. Trough prior to 5th dose which should be at steady state  Vancomycin target trough: 15-2920mcg/ml Ke: 0.087, t1/2:7.9h Vd 51L  Ceftazidime 2gm IV Q8H  Height: 5\' 10"  (177.8 cm) Weight: 160 lb (72.6 kg) IBW/kg (Calculated) : 73  Temp (24hrs), Avg:99.2 F (37.3 Collier), Min:98.7 F (37.1 Collier), Max:99.8 F (37.7 Collier)   Recent Labs Lab 02/01/16 2141 02/02/16 0149 02/02/16 0458  WBC 14.8*  --   --   CREATININE 0.79  --   --   LATICACIDVEN  --  1.2 1.1    Estimated Creatinine Clearance: 127.3 mL/min (by Collier-G formula based on SCr of 0.8 mg/dL).    No Known Allergies  Antimicrobials this admission: 8/11 zosyn x 1 vancomycin 8/11 >>  ceftazidime 8/11 >>   Dose adjustments this admission:   Microbiology results: 8/11 BCx: NGTD x 2 8/11 Fluid knee: pending  Thank you for allowing pharmacy to be a part of this patient's care.  Gregory Collier 02/02/2016 3:08 PM

## 2016-02-02 NOTE — ED Provider Notes (Signed)
[facility] Emergency Department Provider Note  ____________________________________________  Time seen: Approximately 1:48 AM  I have reviewed the triage vital signs and the nursing notes.   HISTORY  Chief Complaint Knee Pain   HPI Gregory Collier is a 40 y.o. male with history of IV drug use and hepatitis C who presents for evaluation of left knee pain. Patient reports a week of swelling and pain, worse with movement, hasn't been able to walk for the last few days, associated with nausea, chills, fever. Patient denies any trauma to his knee. He reports he last used IV drugs yesterday. No prior history of surgeries or hardware on that knee. Currently endorsing moderate pain laying flat but the pain becomes severe with minimal movement of the knee. Patient reports that he tried to drain the swelling at home with a knife.  Past Medical History:  Diagnosis Date  . Hepatitis C     There are no active problems to display for this patient.   Past Surgical History:  Procedure Laterality Date  . ABDOMINAL SURGERY    . HERNIA REPAIR      Prior to Admission medications   Medication Sig Start Date End Date Taking? Authorizing Provider  carisoprodol (SOMA) 350 MG tablet Take 1 tablet (350 mg total) by mouth 3 (three) times daily as needed for muscle spasms. 04/23/15   Orbie Pyo, MD  meloxicam (MOBIC) 15 MG tablet Take 1 tablet (15 mg total) by mouth daily. 01/17/16   Recardo Evangelist, PA-C    Allergies Review of patient's allergies indicates no known allergies.  No family history on file.  Social History Social History  Substance Use Topics  . Smoking status: Current Every Day Smoker    Packs/day: 0.00    Years: 0.00    Types: Cigarettes  . Smokeless tobacco: Never Used  . Alcohol use No    Review of Systems  Constitutional: Negative for fever. Eyes: Negative for visual changes. ENT: Negative for sore throat. Cardiovascular:  Negative for chest pain. Respiratory: Negative for shortness of breath. Gastrointestinal: Negative for abdominal pain, vomiting or diarrhea. Genitourinary: Negative for dysuria. Musculoskeletal: Negative for back pain. + left knee pain and swelling Skin: Negative for rash. Neurological: Negative for headaches, weakness or numbness.  ____________________________________________   PHYSICAL EXAM:  VITAL SIGNS: ED Triage Vitals  Enc Vitals Group     BP 02/01/16 2132 125/78     Pulse Rate 02/01/16 2132 (!) 105     Resp 02/01/16 2132 18     Temp 02/01/16 2132 99.4 F (37.4 C)     Temp Source 02/01/16 2132 Oral     SpO2 02/01/16 2132 96 %     Weight 02/01/16 2132 160 lb (72.6 kg)     Height 02/01/16 2132 _0  (1.778 m)     Head Circumference --      Peak Flow --      Pain Score 02/01/16 2138 10     Pain Loc --      Pain Edu? --      Excl. in [place]? --     Constitutional: Alert and oriented. Well appearing and in no apparent distress. HEENT:      Head: Normocephalic and atraumatic.         Eyes: Conjunctivae are normal. Sclera is non-icteric. EOMI. PERRL      Mouth/Throat: Mucous membranes are moist.       Neck: Supple with no signs of meningismus. Cardiovascular: Regular  rate and rhythm. No murmurs, gallops, or rubs. 2+ symmetrical distal pulses are present in all extremities. No JVD. Respiratory: Normal respiratory effort. Lungs are clear to auscultation bilaterally. No wheezes, crackles, or rhonchi.  Gastrointestinal: Soft, non tender, and non distended with positive bowel sounds. No rebound or guarding. Musculoskeletal: Significant swelling, erythema, and warmth of the left knee with severely decreased range of motion due to pain.  Neurologic: Normal speech and language. Face is symmetric. Moving all extremities. No gross focal neurologic deficits are appreciated. Skin: Skin is warm, dry and intact. No rash noted. Psychiatric: Mood and affect are normal. Speech and behavior  are normal.  ____________________________________________   LABS (all labs ordered are listed, but only abnormal results are displayed)  Labs Reviewed  CBC - Abnormal; Notable for the following:       Result Value   WBC 14.8 (*)    All other components within normal limits  COMPREHENSIVE METABOLIC PANEL - Abnormal; Notable for the following:    Glucose, Bld 125 (*)    Calcium 8.8 (*)    All other components within normal limits  SEDIMENTATION RATE - Abnormal; Notable for the following:    Sed Rate 17 (*)    All other components within normal limits  SYNOVIAL CELL COUNT + DIFF, W/ CRYSTALS - Abnormal; Notable for the following:    Color, Synovial RED (*)    Appearance-Synovial GROSSLY BLOODY (*)    All other components within normal limits  CULTURE, BLOOD (ROUTINE X 2)  CULTURE, BLOOD (ROUTINE X 2)  BODY FLUID CULTURE  GRAM STAIN  LACTIC ACID, PLASMA  C-REACTIVE PROTEIN  LACTIC ACID, PLASMA   ____________________________________________  EKG  None  ____________________________________________  RADIOLOGY  XR left knee: Anterior soft tissue swelling without bony or joint abnormality. ____________________________________________   PROCEDURES  Procedure(s) performed:yes .Joint Aspiration/Arthrocentesis Date/Time: 02/02/2016 5:30 AM Performed by: Rudene Re Authorized by: Rudene Re   Consent:    Consent obtained:  Verbal   Consent given by:  Patient   Risks discussed:  Bleeding and infection Location:    Location:  Knee   Knee:  L knee Anesthesia (see MAR for exact dosages):    Anesthesia method:  None Procedure details:    Preparation: Patient was prepped and draped in usual sterile fashion     Needle gauge:  20 G   Ultrasound guidance: yes     Approach:  Lateral   Aspirate characteristics:  Bloody   Steroid injected: no     Specimen collected: yes   Post-procedure details:    Dressing:  Adhesive bandage   Patient tolerance of  procedure:  Tolerated well, no immediate complications      Critical Care performed: None ____________________________________________   INITIAL IMPRESSION / ASSESSMENT AND PLAN / ED COURSE  40 y.o. male with history of IV drug use and hepatitis C who presents for evaluation of pain,swelling, erythema, warmth of left knee pain x 1 week with associated fever and chills. Current IVD user. Patient is tachycardic, temp of 99.82F, white count of 14.8. Has severely diminished range of motion due to pain, severe amount of swelling, erythema and warmth of the knee. Findings concerning for septic joint. Will perform arthrocentesis and send fluid for analysis, get an ESR and a CRP, blood cultures, lactate. XR with no acute findings.  Clinical Course  Comment By Time  ESR and WBC elevated with exam concerning for septic joint. Tap was very bloody therefore I am not relying on the results  of cell count. Gram stain and culture pending. Discussed with the hospitalist for admission for IV abx and ortho evaluation in the am Rudene Re, MD 08/11 [phone]    Pertinent labs & imaging results that were available during my care of the patient were reviewed by me and considered in my medical decision making (see chart for details).    ____________________________________________   FINAL CLINICAL IMPRESSION(S) / ED DIAGNOSES  Final diagnoses:  Pyogenic arthritis of left knee joint, due to unspecified organism [facility])      NEW MEDICATIONS STARTED DURING THIS VISIT:  New Prescriptions   No medications on file     Note:  This document was prepared using Dragon voice recognition software and may include unintentional dictation errors.    Rudene Re, MD 02/02/16 [phone]

## 2016-02-02 NOTE — Progress Notes (Signed)
Went to pt room to answer bed alarm. Girlfriend getting pt up to the bathroom. Went in to assist pt when finished and found that he had smoked a cigarette.  Advised patient that smoking is strictly prohibited and unsafe for the entire hospital and not to try that again. Patient apologized and agreed not to do it again.  I also told patient not to get up without the help from a staff member b/c he is unsteady on his feet. He agreed he would call for help in the future.

## 2016-02-02 NOTE — ED Notes (Signed)
Attempted report to unit.  All nurses are in report at this time.

## 2016-02-02 NOTE — H&P (Addendum)
Gregory Collier is an 40 y.o. male.   Chief Complaint: Knee pain HPI: The patient with past medical history of IV drug abuse and hepatitis C presents to the emergency department complaining of left knee pain. He states that his knee has been swelling and gradually more painful the last 2 days. He denies trauma to the knee. The patient also denies fever, chest pain or shortness of breath. He admits to nausea. Laboratory evaluation in the emergency department showed leukocytosis. Drainage of the knee revealed grossly bloody fluid. Due to concern for septic arthritis emergency department staff called for admission.  Past Medical History:  Diagnosis Date  . Hepatitis C     Past Surgical History:  Procedure Laterality Date  . ABDOMINAL SURGERY    . HERNIA REPAIR      No family history on file. Denies CAD or DM2 within immediate family Social History:  reports that he has been smoking Cigarettes.  He has been smoking about 0.00 packs per day for the past 0.00 years. He has never used smokeless tobacco. He reports that he uses drugs. He reports that he does not drink alcohol.  Allergies: No Known Allergies  Medications Prior to Admission  Medication Sig Dispense Refill  . meloxicam (MOBIC) 15 MG tablet Take 1 tablet (15 mg total) by mouth daily. 10 tablet 0  . carisoprodol (SOMA) 350 MG tablet Take 1 tablet (350 mg total) by mouth 3 (three) times daily as needed for muscle spasms. (Patient not taking: Reported on 02/02/2016) 15 tablet 0    Results for orders placed or performed during the hospital encounter of 02/02/16 (from the past 48 hour(s))  CBC     Status: Abnormal   Collection Time: 02/01/16  9:41 PM  Result Value Ref Range   WBC 14.8 (H) 3.8 - 10.6 K/uL   RBC 4.85 4.40 - 5.90 MIL/uL   Hemoglobin 14.6 13.0 - 18.0 g/dL   HCT 42.9 40.0 - 52.0 %   MCV 88.5 80.0 - 100.0 fL   MCH 30.1 26.0 - 34.0 pg   MCHC 34.0 32.0 - 36.0 g/dL   RDW 14.5 11.5 - 14.5 %   Platelets 313 150 - 440  K/uL  Comprehensive metabolic panel     Status: Abnormal   Collection Time: 02/01/16  9:41 PM  Result Value Ref Range   Sodium 136 135 - 145 mmol/L   Potassium 4.2 3.5 - 5.1 mmol/L   Chloride 103 101 - 111 mmol/L   CO2 26 22 - 32 mmol/L   Glucose, Bld 125 (H) 65 - 99 mg/dL   BUN 14 6 - 20 mg/dL   Creatinine, Ser 0.79 0.61 - 1.24 mg/dL   Calcium 8.8 (L) 8.9 - 10.3 mg/dL   Total Protein 7.7 6.5 - 8.1 g/dL   Albumin 3.6 3.5 - 5.0 g/dL   AST 25 15 - 41 U/L   ALT 25 17 - 63 U/L   Alkaline Phosphatase 56 38 - 126 U/L   Total Bilirubin 0.6 0.3 - 1.2 mg/dL   GFR calc non Af Amer >60 >60 mL/min   GFR calc Af Amer >60 >60 mL/min    Comment: (NOTE) The eGFR has been calculated using the CKD EPI equation. This calculation has not been validated in all clinical situations. eGFR's persistently <60 mL/min signify possible Chronic Kidney Disease.    Anion gap 7 5 - 15  Sedimentation rate     Status: Abnormal   Collection Time: 02/02/16  1:49 AM  Result Value Ref Range   Sed Rate 17 (H) 0 - 15 mm/hr  Lactic acid, plasma     Status: None   Collection Time: 02/02/16  1:49 AM  Result Value Ref Range   Lactic Acid, Venous 1.2 0.5 - 1.9 mmol/L  Synovial cell count + diff, w/ crystals     Status: Abnormal   Collection Time: 02/02/16  1:49 AM  Result Value Ref Range   Color, Synovial RED (A) YELLOW   Appearance-Synovial GROSSLY BLOODY (A) CLEAR   Crystals, Fluid NO CRYSTALS SEEN    WBC, Synovial 0 0 - 200 /cu mm    Comment: GROSSLY BLOODY SAMPLE REQUIRED DILUTION THAT MAY CAUSE LOW CONCENTRATION WBC  INTEPRET RESULTS WITH CAUTION    Neutrophil, Synovial 89 %    Comment: DIFFERENTIAL PERFORMED ON CONCENTRATED SAMPLE   Lymphocytes-Synovial Fld 11 %   Monocyte-Macrophage-Synovial Fluid 0 %   Eosinophils-Synovial 0 %   Other Cells-SYN 0   Lactic acid, plasma     Status: None   Collection Time: 02/02/16  4:58 AM  Result Value Ref Range   Lactic Acid, Venous 1.1 0.5 - 1.9 mmol/L   Dg Knee  Complete 4 Views Left  Result Date: 02/01/2016 CLINICAL DATA:  Acute left knee pain for 1 week.  No known injury. EXAM: LEFT KNEE - COMPLETE 4+ VIEW COMPARISON:  None. FINDINGS: There is no evidence of fracture, subluxation or dislocation. The joint space is unremarkable. There is no evidence of joint effusion. Anterior soft tissue swelling is noted. There is no evidence of osteomyelitis. IMPRESSION: Anterior soft tissue swelling without bony or joint abnormality. Electronically Signed   By: Margarette Canada M.D.   On: 02/01/2016 22:23    Review of Systems  Constitutional: Negative for chills and fever.  HENT: Negative for sore throat and tinnitus.   Eyes: Negative for blurred vision and redness.  Respiratory: Negative for cough and shortness of breath.   Cardiovascular: Negative for chest pain, palpitations, orthopnea and PND.  Gastrointestinal: Positive for nausea. Negative for abdominal pain, diarrhea and vomiting.  Genitourinary: Negative for dysuria, frequency and urgency.  Musculoskeletal: Negative for joint pain and myalgias.  Skin: Negative for rash.       No lesions  Neurological: Negative for speech change, focal weakness and weakness.  Endo/Heme/Allergies: Does not bruise/bleed easily.       No temperature intolerance  Psychiatric/Behavioral: Negative for depression and suicidal ideas.    Blood pressure 140/81, pulse 77, temperature 98.7 F (37.1 C), temperature source Oral, resp. rate 18, height '5\' 10"'$  (1.778 m), weight 72.6 kg (160 lb), SpO2 100 %. Physical Exam  Constitutional: He is oriented to person, place, and time. He appears well-developed and well-nourished. No distress.  HENT:  Head: Normocephalic and atraumatic.  Mouth/Throat: Oropharynx is clear and moist.  Eyes: Conjunctivae and EOM are normal. Pupils are equal, round, and reactive to light. No scleral icterus.  Neck: Normal range of motion. Neck supple. No JVD present. No tracheal deviation present. No thyromegaly  present.  Cardiovascular: Normal rate, regular rhythm and normal heart sounds.  Exam reveals no gallop and no friction rub.   No murmur heard. Respiratory: Breath sounds normal. No respiratory distress.  GI: Soft. Bowel sounds are normal. He exhibits no distension. There is no tenderness.  Genitourinary:  Genitourinary Comments: Deferred  Musculoskeletal: He exhibits edema (Left knee) and tenderness (Left knee).  Lymphadenopathy:    He has no cervical adenopathy.  Neurological: He is oriented to person, place,  and time. No cranial nerve deficit.  Very somnolent  Skin: Skin is warm and dry. No rash noted. No erythema.  Psychiatric: He has a normal mood and affect. His behavior is normal. Judgment and thought content normal.     Assessment/Plan This is a 40 year old male admitted for septic arthritis. 1. Septic arthritis: Left knee; continue broad-spectrum antibiotics. Check GC/chlamydia. Orthopedic surgery consult if needed. No concern for endocarditis at this time. 2. Hepatitis C: The patient is currently not in treatment 3. DVT prophylaxis: SCDs 4. GI prophylaxis: None The patient is a full code. Time spent on admission orders and patient care approximately 45 minutes  Harrie Foreman, MD 02/02/2016, 8:06 AM

## 2016-02-02 NOTE — Clinical Social Work Note (Signed)
Clinical Social Work Assessment  Patient Details  Name: Gregory Collier MRN: [id number] Date of Birth: 1975/10/15  Date of referral:  02/02/16               Reason for consult:  [employer], Substance Use/ETOH Abuse, Housing Concerns/Homelessness                Permission sought to share information with:    Permission granted to share information::     Name::        Agency::     Relationship::     Contact Information:     Housing/Transportation Living arrangements for the past 2 months:  No permanent address Source of Information:  Patient Patient Interpreter Needed:  None Criminal Activity/Legal Involvement Pertinent to Current Situation/Hospitalization:  No - Comment as needed Significant Relationships:  Friend Lives with:  Other (Comment) (Stays with different friends. ) Do you feel safe going back to the place where you live?  Yes Need for family participation in patient care:  No (Coment)  Care giving concerns:  Patient stays with different friends in [place].    Social Worker assessment / plan:  Holiday representative ([place]) received a consult for homelessness. CSW reviewed chart and noted that patient has a history of substance abuse. CSW met with patient to complete assessment. Patient was guarded and did not elaborate on many questions and just answered yes or no. Patient reported that he lives in [place] does not have a permanent residence and is staying with friends. 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 Patient reported that he has Medicaid. Patient denied alcohol and drug use. Patient reported no needs or concerns at this time. CSW provided patient with list of [facility].   Employment status:  Disabled (Comment on whether or not currently receiving Disability) Insurance information:  Medicaid In [place] PT Recommendations:  Not assessed at this time Information / Referral to  community resources:  Other (Comment Required) [facility]. )  Patient/Family's Response to care:  Patient reported that he has no concerns at this time.   Patient/Family's Understanding of and Emotional Response to Diagnosis, Current Treatment, and Prognosis:  Patient had a flat affect and did not elaborate on questions.   Emotional Assessment Appearance:  Appears older than stated age Attitude/Demeanor/Rapport:  Guarded Affect (typically observed):  Flat, Guarded Orientation:  Oriented to Self, Oriented to Place, Oriented to  Time, Oriented to Situation Alcohol / Substance use:  Illicit Drugs Psych involvement (Current and /or in the community):  No (Comment)  Discharge Needs  Concerns to be addressed:  Discharge Planning Concerns Readmission within the last 30 days:  No Current discharge risk:  None Barriers to Discharge:  Continued Medical Work up   [employer], Veronia Beets, LCSW 02/02/2016, 4:34 PM

## 2016-02-02 NOTE — Consult Note (Signed)
ORTHOPAEDIC CONSULTATION  REQUESTING PHYSICIAN: Alford Highlandichard Wieting, MD  Chief Complaint: Left knee pain and swelling  HPI: Gregory Collier is a 40 y.o. male who complains of  left knee pain and swelling for 1-2 weeks.  Patient is an IV drug abuser using heroin.  He also has hepatitis C.  His increased pain in the last few days and probably walking.  He tried to lanced the swelling at home with a knife.  He was seen and admitted through the emergency room last night for sepsis of the around the knee joint.  His white blood count slightly elevated.  Sedimentation rate was minimally elevated as was a CRP.  A bloody tap of the swollen area was obtained and cultures are negative so far.  He is on IV antibiotics.  Past Medical History:  Diagnosis Date  . Hepatitis C    Past Surgical History:  Procedure Laterality Date  . ABDOMINAL SURGERY    . HERNIA REPAIR     Social History   Social History  . Marital status: Married    Spouse name: N/A  . Number of children: N/A  . Years of education: N/A   Social History Main Topics  . Smoking status: Current Every Day Smoker    Packs/day: 0.00    Years: 0.00    Types: Cigarettes  . Smokeless tobacco: Never Used  . Alcohol use No  . Drug use:      Comment: HEROIN ADDICTION  . Sexual activity: Not on file   Other Topics Concern  . Not on file   Social History Narrative  . No narrative on file   No family history on file. No Known Allergies Prior to Admission medications   Medication Sig Start Date End Date Taking? Authorizing Provider  meloxicam (MOBIC) 15 MG tablet Take 1 tablet (15 mg total) by mouth daily. 01/17/16  Yes Bethel BornKelly Marie Gekas, PA-C  carisoprodol (SOMA) 350 MG tablet Take 1 tablet (350 mg total) by mouth 3 (three) times daily as needed for muscle spasms. Patient not taking: Reported on 02/02/2016 04/23/15   Myrna Blazeravid Matthew Schaevitz, MD   Dg Knee Complete 4 Views Left  Result Date: 02/01/2016 CLINICAL DATA:  Acute left  knee pain for 1 week.  No known injury. EXAM: LEFT KNEE - COMPLETE 4+ VIEW COMPARISON:  None. FINDINGS: There is no evidence of fracture, subluxation or dislocation. The joint space is unremarkable. There is no evidence of joint effusion. Anterior soft tissue swelling is noted. There is no evidence of osteomyelitis. IMPRESSION: Anterior soft tissue swelling without bony or joint abnormality. Electronically Signed   By: Harmon PierJeffrey  Hu M.D.   On: 02/01/2016 22:23    Positive ROS: All other systems have been reviewed and were otherwise negative with the exception of those mentioned in the HPI and as above.  Physical Exam: General: Alert, no acute distress Cardiovascular: No pedal edema Respiratory: No cyanosis, no use of accessory musculature GI: No organomegaly, abdomen is soft and non-tender Skin: No lesions in the area of chief complaint Neurologic: Sensation intact distally Psychiatric: Patient is competent for consent with normal mood and affect Lymphatic: No axillary or cervical lymphadenopathy  MUSCULOSKELETAL: Thin male lying in bed sleeping.  The left knee is flexed to 90.  Upon awakening, he complains of knee pain knee pain.  He has mild fluctuance in the prepatellar bursa.  There is some redness and swelling but minimal warmth.  The joint lines of the knee and not especially tender.  Gentle passive motion.  Knee is not totally painful.  Neurovascular status is good distally.  X-rays of the joint itself are unremarkable.  Assessment: Probable infected prepatellar bursa left knee  Plan: Recommend continued IV antibiotics. Moist heat with K pad. No need for I&D at this point.    Valinda Hoar, MD [phone]   02/02/2016 2:04 PM

## 2016-02-02 NOTE — Progress Notes (Signed)
Ordered k-pad from supply.

## 2016-02-02 NOTE — Progress Notes (Signed)
[facility] PROGRESS NOTE  Gregory Collier ZOX:[id number] DOB: [birth year] DOA: 02/02/2016 PCP: No PCP Per Patient  HPI/Subjective: Patient with severe left knee pain and unable to bend his knee. The patient has a history of IV drug use with heroin. Patient is in a lot of pain 10 out of 10 intensity. Nothing that we gave has helped him. Patient asking for IV dilaudid and oxycodone.  Objective: Vitals:   02/02/16 0712 02/02/16 0751  BP:  140/81  Pulse:  77  Resp:  18  Temp: 98.9 F (37.2 C) 98.7 F (37.1 C)    Filed Weights   02/01/16 2132  Weight: 72.6 kg (160 lb)    ROS: Review of Systems  Constitutional: Negative for chills and fever.  Eyes: Negative for blurred vision.  Respiratory: Negative for cough and shortness of breath.   Cardiovascular: Negative for chest pain.  Gastrointestinal: Negative for abdominal pain, constipation, diarrhea, nausea and vomiting.  Genitourinary: Negative for dysuria.  Musculoskeletal: Positive for joint pain.  Neurological: Negative for dizziness and headaches.   Exam: Physical Exam  Constitutional: He is oriented to person, place, and time.  HENT:  Nose: No mucosal edema.  Mouth/Throat: No oropharyngeal exudate or posterior oropharyngeal edema.  Eyes: Conjunctivae, EOM and lids are normal. Pupils are equal, round, and reactive to light.  Neck: No JVD present. Carotid bruit is not present. No edema present. No thyroid mass and no thyromegaly present.  Cardiovascular: S1 normal and S2 normal.  Exam reveals no gallop.   No murmur heard. Pulses:      Dorsalis pedis pulses are 2+ on the right side, and 2+ on the left side.  Respiratory: No respiratory distress. He has no wheezes. He has no rhonchi. He has no rales.  GI: Soft. Bowel sounds are normal. There is no tenderness.  Musculoskeletal:       Left knee: He exhibits decreased range of motion and swelling.       Right ankle: He exhibits no swelling.       Left ankle: He exhibits  no swelling.  Lymphadenopathy:    He has no cervical adenopathy.  Neurological: He is alert and oriented to person, place, and time. No cranial nerve deficit.  Skin: Skin is warm. No rash noted. Nails show no clubbing.  Erythema and swelling over the left prepatellar bursa.  Psychiatric: He has a normal mood and affect.      Data Reviewed: Basic Metabolic Panel:  Recent Labs Lab 02/01/16 2141  NA 136  K 4.2  CL 103  CO2 26  GLUCOSE 125*  BUN 14  CREATININE 0.79  CALCIUM 8.8*   Liver Function Tests:  Recent Labs Lab 02/01/16 2141  AST 25  ALT 25  ALKPHOS 56  BILITOT 0.6  PROT 7.7  ALBUMIN 3.6   CBC:  Recent Labs Lab 02/01/16 2141  WBC 14.8*  HGB 14.6  HCT 42.9  MCV 88.5  PLT 313     Recent Results (from the past 240 hour(s))  Body fluid culture     Status: None (Preliminary result)   Collection Time: 02/02/16  1:49 AM  Result Value Ref Range Status   Specimen Description KNEE  Final   Special Requests NONE  Final   Gram Stain   Final    MODERATE WBC PRESENT, PREDOMINANTLY PMN NO ORGANISMS SEEN Performed at [facility]    Culture PENDING  Incomplete   Report Status PENDING  Incomplete  Blood culture (routine x 2)  Status: None (Preliminary result)   Collection Time: 02/02/16  1:50 AM  Result Value Ref Range Status   Specimen Description BLOOD LEFT ASSIST CONTROL  Final   Special Requests BOTTLES DRAWN AEROBIC AND ANAEROBIC 8CC  Final   Culture NO GROWTH < 12 HOURS  Final   Report Status PENDING  Incomplete  Blood culture (routine x 2)     Status: None (Preliminary result)   Collection Time: 02/02/16  1:51 AM  Result Value Ref Range Status   Specimen Description BLOOD RIGHT ASSIST CONTROL  Final   Special Requests BOTTLES DRAWN AEROBIC AND ANAEROBIC 10CC  Final   Culture NO GROWTH < 12 HOURS  Final   Report Status PENDING  Incomplete     Studies: Dg Knee Complete 4 Views Left  Result Date: 02/01/2016 CLINICAL DATA:  Acute left  knee pain for 1 week.  No known injury. EXAM: LEFT KNEE - COMPLETE 4+ VIEW COMPARISON:  None. FINDINGS: There is no evidence of fracture, subluxation or dislocation. The joint space is unremarkable. There is no evidence of joint effusion. Anterior soft tissue swelling is noted. There is no evidence of osteomyelitis. IMPRESSION: Anterior soft tissue swelling without bony or joint abnormality. Electronically Signed   By: Harmon PierJeffrey  Hu M.D.   On: 02/01/2016 22:23    Scheduled Meds: . cefTAZidime (FORTAZ)  IV  2 g Intravenous Q8H  . docusate sodium  100 mg Oral BID  . vancomycin  1,000 mg Intravenous Q8H    Assessment/Plan:  1. Infected prepatellar bursa left knee. Continue vancomycin and ceftazidime for right now. Knee joint culture showing no organisms so far. Warm compresses. Pain control with when necessary medication. Appreciate orthopedic consultation. 2. Recent abscess drainage with MRSA right knee area back in July. 3. Heroin abuse. Advised he must stop heroin. I advised him I will not be able to control his pain in the way that he would like. 4. Tobacco abuse smoking cessation counseling done 3 minutes by me. Patient refused nicotine patch. 5. With IV drug abuse I will check hepatitis profile and HIV.  Code Status:     Code Status Orders        Start     Ordered   02/02/16 0744  Full code  Continuous     02/02/16 0743    Code Status History    Date Active Date Inactive Code Status Order ID Comments User Context   This patient has a current code status but no historical code status.      Disposition Plan: Home once able to bend left knee better  Consultants:  Orthopedic surgery. Case discussed with orthopedic surgery  Antibiotics:  Vancomycin  Zosyn  Time spent: 35 minutes  Alford HighlandWIETING, Geroge Gilliam  [employer]

## 2016-02-03 LAB — HEPATITIS PANEL, ACUTE
HCV Ab: >11 s/co ratio — ABNORMAL HIGH (ref 0.0–0.9)
HEP B C IGM: NEGATIVE
Hep A IgM: NEGATIVE
Hepatitis B Surface Ag: NEGATIVE

## 2016-02-03 LAB — CBC
HCT: 36.9 % — ABNORMAL LOW (ref 40.0–52.0)
Hemoglobin: 12.7 g/dL — ABNORMAL LOW (ref 13.0–18.0)
MCH: 29.9 pg (ref 26.0–34.0)
MCHC: 34.5 g/dL (ref 32.0–36.0)
MCV: 86.8 fL (ref 80.0–100.0)
Platelets: 266 10*3/uL (ref 150–440)
RBC: 4.25 MIL/uL — ABNORMAL LOW (ref 4.40–5.90)
RDW: 14.3 % (ref 11.5–14.5)
WBC: 13.3 10*3/uL — AB (ref 3.8–10.6)

## 2016-02-03 LAB — CREATININE, SERUM: CREATININE: 0.76 mg/dL (ref 0.61–1.24)

## 2016-02-03 MED ORDER — OXYCODONE HCL 10 MG PO TABS: 10.0000 mg | ORAL_TABLET | ORAL | 0 refills | Status: AC | PRN

## 2016-02-03 MED ORDER — SULFAMETHOXAZOLE-TRIMETHOPRIM 800-160 MG PO TABS: 1.0000 | ORAL_TABLET | Freq: Two times a day (BID) | ORAL | 0 refills | Status: DC

## 2016-02-03 NOTE — Discharge Instructions (Signed)

## 2016-02-03 NOTE — Progress Notes (Signed)
Patient is not contagious. I recommend wash hands after contact with patient.

## 2016-02-03 NOTE — Discharge Summary (Signed)
Gregory Collier, 40 y.o., DOB 12/11/1975, MRN [id number]. Admission date: 02/02/2016 Discharge Date 02/03/2016 Primary MD No PCP Per Patient Admitting Physician Arnaldo NatalMichael S Diamond, MD  Admission Diagnosis  Pyogenic arthritis of left knee joint, due to unspecified organism [facility](HCC) [M00.9]  Discharge Diagnosis   Active Problems: Infected prepatellar bursa Heroine abuse Tobacco abuse Hepatitis C      Hospital Course * The patient with past medical history of IV drug abuse and hepatitis C presents to the emergency department complaining of left knee pain. He states that his knee has been swelling and gradually more painful the last 2 days. Patient underwent a drainage of the knee which showed a bloody tap. The culture did not grow any bacteria. Patient has been very anxious to go home. He is able to ambulate on that foot. I will continue Bactrim but outpatient follow-up with ID and orthopedics if he needs further drainage.            Consults  orthopedic surgery  Significant Tests:  See full reports for all details    Dg Knee Complete 4 Views Left  Result Date: 02/01/2016 CLINICAL DATA:  Acute left knee pain for 1 week.  No known injury. EXAM: LEFT KNEE - COMPLETE 4+ VIEW COMPARISON:  None. FINDINGS: There is no evidence of fracture, subluxation or dislocation. The joint space is unremarkable. There is no evidence of joint effusion. Anterior soft tissue swelling is noted. There is no evidence of osteomyelitis. IMPRESSION: Anterior soft tissue swelling without bony or joint abnormality. Electronically Signed   By: Harmon PierJeffrey  Hu M.D.   On: 02/01/2016 22:23       Today   Subjective:   Gregory Collier  patient feels okay has been asking nurse if he when he can go home.  Objective:   Blood pressure 114/65, pulse 81, temperature 98.5 F (36.9 C), temperature source Oral, resp. rate 18, height 5\' 10"  (1.778 m), weight 72.3 kg (159 lb 6.3 oz), SpO2 100 %.  .  Intake/Output Summary  (Last 24 hours) at 02/03/16 1312 Last data filed at 02/03/16 0500  Gross per 24 hour  Intake             2240 ml  Output                0 ml  Net             2240 ml    Exam VITAL SIGNS: Blood pressure 114/65, pulse 81, temperature 98.5 F (36.9 C), temperature source Oral, resp. rate 18, height 5\' 10"  (1.778 m), weight 72.3 kg (159 lb 6.3 oz), SpO2 100 %.  GENERAL:  40 y.o.-year-old patient lying in the bed with no acute distress.  EYES: Pupils equal, round, reactive to light and accommodation. No scleral icterus. Extraocular muscles intact.  HEENT: Head atraumatic, normocephalic. Oropharynx and nasopharynx clear.  NECK:  Supple, no jugular venous distention. No thyroid enlargement, no tenderness.  LUNGS: Normal breath sounds bilaterally, no wheezing, rales,rhonchi or crepitation. No use of accessory muscles of respiration.  CARDIOVASCULAR: S1, S2 normal. No murmurs, rubs, or gallops.  ABDOMEN: Soft, nontender, nondistended. Bowel sounds present. No organomegaly or mass.  EXTREMITIES: Swelling of the left knee improved NEUROLOGIC: Cranial nerves II through XII are intact. Muscle strength 5/5 in all extremities. Sensation intact. Gait not checked.  PSYCHIATRIC: The patient is alert and oriented x 3.  SKIN: No obvious rash, lesion, or ulcer.   Data Review     CBC w Diff:  Lab Results  Component Value Date   WBC 13.3 (H) 02/03/2016   HGB 12.7 (L) 02/03/2016   HGB 13.7 07/17/2012   HCT 36.9 (L) 02/03/2016   HCT 39.3 (L) 07/17/2012   PLT 266 02/03/2016   PLT 171 07/17/2012   LYMPHOPCT 39 04/23/2015   LYMPHOPCT 37.0 07/17/2012   MONOPCT 9 04/23/2015   MONOPCT 11.1 07/17/2012   EOSPCT 11 04/23/2015   EOSPCT 1.0 07/17/2012   BASOPCT 1 04/23/2015   BASOPCT 0.2 07/17/2012   CMP: Lab Results  Component Value Date   NA 136 02/01/2016   NA 138 07/16/2012   K 4.2 02/01/2016   K 3.9 07/16/2012   CL 103 02/01/2016   CL 105 07/16/2012   CO2 26 02/01/2016   CO2 30 07/16/2012    BUN 14 02/01/2016   BUN 12 07/16/2012   CREATININE 0.76 02/03/2016   CREATININE 1.04 07/16/2012   PROT 7.7 02/01/2016   PROT 8.2 07/16/2012   ALBUMIN 3.6 02/01/2016   ALBUMIN 4.3 07/16/2012   BILITOT 0.6 02/01/2016   BILITOT 0.6 07/16/2012   ALKPHOS 56 02/01/2016   ALKPHOS 62 07/16/2012   AST 25 02/01/2016   AST 56 (H) 07/16/2012   ALT 25 02/01/2016   ALT 147 (H) 07/16/2012  .  Micro Results Recent Results (from the past 240 hour(s))  Body fluid culture     Status: None (Preliminary result)   Collection Time: 02/02/16  1:49 AM  Result Value Ref Range Status   Specimen Description KNEE  Final   Special Requests NONE  Final   Gram Stain   Final    MODERATE WBC PRESENT, PREDOMINANTLY PMN NO ORGANISMS SEEN Performed at [facility]    Culture PENDING  Incomplete   Report Status PENDING  Incomplete  Blood culture (routine x 2)     Status: None (Preliminary result)   Collection Time: 02/02/16  1:50 AM  Result Value Ref Range Status   Specimen Description BLOOD LEFT ASSIST CONTROL  Final   Special Requests BOTTLES DRAWN AEROBIC AND ANAEROBIC 8CC  Final   Culture NO GROWTH 1 DAY  Final   Report Status PENDING  Incomplete  Blood culture (routine x 2)     Status: None (Preliminary result)   Collection Time: 02/02/16  1:51 AM  Result Value Ref Range Status   Specimen Description BLOOD RIGHT ASSIST CONTROL  Final   Special Requests BOTTLES DRAWN AEROBIC AND ANAEROBIC 10CC  Final   Culture NO GROWTH 1 DAY  Final   Report Status PENDING  Incomplete  Chlamydia/NGC rt PCR ([facility] only)     Status: None   Collection Time: 02/02/16  5:10 PM  Result Value Ref Range Status   Specimen source GC/Chlam URINE, RANDOM  Final   Chlamydia Tr NOT DETECTED NOT DETECTED Final   N gonorrhoeae NOT DETECTED NOT DETECTED Final    Comment: (NOTE) 100  This methodology has not been evaluated in pregnant women or in 200  patients with a history of hysterectomy. 300 400  This methodology will  not be performed on patients less than 104  years of age.         Code Status Orders        Start     Ordered   02/02/16 0744  Full code  Continuous     02/02/16 0743    Code Status History    Date Active Date Inactive Code Status Order ID Comments User Context   This patient has  a current code status but no historical code status.          Follow-up Information    FITZGERALD, DAVID P, MD Follow up in 7 day(s).   Specialty:  Infectious Diseases Why:  possible knee infection Contact information: [address] [place] [address] [phone]        Valinda Hoar, MD Follow up in 5 day(s).   Specialty:  Specialist Why:  evalulate knee  Contact information: [address] [place] [address] [phone]           Discharge Medications     Medication List    TAKE these medications   carisoprodol 350 MG tablet Commonly known as:  SOMA Take 1 tablet (350 mg total) by mouth 3 (three) times daily as needed for muscle spasms.   meloxicam 15 MG tablet Commonly known as:  MOBIC Take 1 tablet (15 mg total) by mouth daily.   Oxycodone HCl 10 MG Tabs Take 1 tablet (10 mg total) by mouth every 4 (four) hours as needed for moderate pain or breakthrough pain.   sulfamethoxazole-trimethoprim 800-160 MG tablet Commonly known as:  BACTRIM DS,SEPTRA DS Take 1 tablet by mouth 2 (two) times daily.          Total Time in preparing paper work, data evaluation and todays exam - 35 minutes  Auburn Bilberry M.D on 02/03/2016 at 1:12 PM  [facility]   Office  [phone]

## 2016-02-03 NOTE — Progress Notes (Signed)
Patient is being discharged to home. Rx and d/c instructions given and acknowledged. IV's removed. Dr. Allena KatzPatel wrote note for pt's brother stating that patient is not contagious from the MRSA, but recommendation to wash hands after contact with patient.

## 2016-02-05 LAB — BODY FLUID CULTURE: CULTURE: NO GROWTH

## 2016-02-07 LAB — CULTURE, BLOOD (ROUTINE X 2)
CULTURE: NO GROWTH
Culture: NO GROWTH

## 2016-02-27 ENCOUNTER — Emergency Department
Admission: EM | Admit: 2016-02-27 | Discharge: 2016-02-27 | Disposition: A | Discharge status: Home / Self Care | Payer: Medicaid Other | Attending: Student in an Organized Health Care Education/Training Program | Admitting: Student in an Organized Health Care Education/Training Program

## 2016-02-27 DIAGNOSIS — T401X1A Poisoning by heroin, accidental (unintentional), initial encounter: Secondary | ICD-10-CM | POA: Insufficient documentation

## 2016-02-27 DIAGNOSIS — F17210 Nicotine dependence, cigarettes, uncomplicated: Secondary | ICD-10-CM | POA: Diagnosis not present

## 2016-02-27 MED ORDER — SODIUM CHLORIDE 0.9 % IV BOLUS (SEPSIS)
1000.0000 mL | Freq: Once | INTRAVENOUS | Status: AC
  Administered 2016-02-27: 1000 mL via INTRAVENOUS

## 2016-02-27 MED ORDER — NALOXONE HCL 4 MG/0.1ML NA LIQD: NASAL | 0 refills | Status: AC

## 2016-02-27 NOTE — ED Triage Notes (Signed)
Pt was found unconscious in drivers seat of car, EMS gave 2mg  narcan and pt became alert. Pt reports took 1/2 gram of heroin today

## 2016-02-27 NOTE — ED Provider Notes (Signed)
[facility] Emergency Department Provider Note    First MD Initiated Contact with Patient 02/27/16 1800     (approximate)  I have reviewed the triage vital signs and the nursing notes.   HISTORY  Chief Complaint Drug Overdose    HPI Gregory Collier is a 40 y.o. male history of hepatitis C as well as chronic IV narcotic abuse presents after being found unresponsive by EMS. Patient had accidental overdose on heroin. Patient he is a frequent user of roughly 1 g a day. States that he only uses half a gram today but thinks it is heroin was mixed. Patient was found slumped over in his car and was given 2 kg of intranasal Narcan with complete resolution of his symptoms. Denies any other substance abuse. Denies any pain or shortness of breath at this time. Denies any numbness or tingling. Denies any headache.   Past Medical History:  Diagnosis Date  . Hepatitis C     Patient Active Problem List   Diagnosis Date Noted  . Septic arthritis (HCC) 02/02/2016    Past Surgical History:  Procedure Laterality Date  . ABDOMINAL SURGERY    . HERNIA REPAIR      Prior to Admission medications   Medication Sig Start Date End Date Taking? Authorizing Provider  carisoprodol (SOMA) 350 MG tablet Take 1 tablet (350 mg total) by mouth 3 (three) times daily as needed for muscle spasms. Patient not taking: Reported on 02/02/2016 04/23/15   Myrna Blazer, MD  meloxicam (MOBIC) 15 MG tablet Take 1 tablet (15 mg total) by mouth daily. 01/17/16   Bethel Born, PA-C  oxyCODONE 10 MG TABS Take 1 tablet (10 mg total) by mouth every 4 (four) hours as needed for moderate pain or breakthrough pain. 02/03/16   Auburn Bilberry, MD  sulfamethoxazole-trimethoprim (BACTRIM DS,SEPTRA DS) 800-160 MG tablet Take 1 tablet by mouth 2 (two) times daily. 02/03/16   Auburn Bilberry, MD    Allergies Review of patient's allergies indicates no known allergies.  No family history on  file.  Social History Social History  Substance Use Topics  . Smoking status: Current Every Day Smoker    Packs/day: 0.00    Years: 0.00    Types: Cigarettes  . Smokeless tobacco: Never Used  . Alcohol use No    Review of Systems Patient denies headaches, rhinorrhea, blurry vision, numbness, shortness of breath, chest pain, edema, cough, abdominal pain, nausea, vomiting, diarrhea, dysuria, fevers, rashes or hallucinations unless otherwise stated above in HPI. ____________________________________________   PHYSICAL EXAM:  VITAL SIGNS: Vitals:   02/27/16 1809 02/27/16 1810  BP: 122/83   Pulse: (!) 109   Temp:  99.8 F (37.7 C)    Constitutional: Alert and oriented. Well appearing and in no acute distress. Eyes: Conjunctivae are normal. PERRL. EOMI. Head: Atraumatic. Nose: No congestion/rhinnorhea. Mouth/Throat: Mucous membranes are moist.  Oropharynx non-erythematous. Neck: No stridor. Painless ROM. No cervical spine tenderness to palpation Hematological/Lymphatic/Immunilogical: No cervical lymphadenopathy. Cardiovascular: Normal rate, regular rhythm. Grossly normal heart sounds.  Good peripheral circulation. Respiratory: Normal respiratory effort.  No retractions. Lungs CTAB. Gastrointestinal: Soft and nontender. No distention. No abdominal bruits. No CVA tenderness. Genitourinary:  Musculoskeletal: No lower extremity tenderness nor edema.  No joint effusions. Neurologic:  Normal speech and language. No gross focal neurologic deficits are appreciated. No gait instability. Skin:  Skin is warm, dry and intact. No rash noted.  Track marks to bilateral upper extremities Psychiatric: Mood and affect are  normal. Speech and behavior are normal.  ____________________________________________   LABS (all labs ordered are listed, but only abnormal results are displayed)  No results found for this or any previous visit (from the past 24  hour(s)). ____________________________________________  EKG____________________________________________  RADIOLOGY   ____________________________________________   PROCEDURES  Procedure(s) performed: none    Critical Care performed: no ____________________________________________   INITIAL IMPRESSION / ASSESSMENT AND PLAN / ED COURSE  Pertinent labs & imaging results that were available during my care of the patient were reviewed by me and considered in my medical decision making (see chart for details).  DDX: Accidental overdose, intentional overdose, medication misuse  Gregory Collier is a 40 y.o. who presents to the ED with accidental heroin overdose with appropriate response to Narcan. Patient is currently hemodynamically stable. No focal neurodeficits. Patient denies any other substance ingestion. Patient without any respiratory suppression at this time. Will be kept on the monitor and observed for one hour.    Clinical Course  Comment By Time  Patient remains hemodynamically stable. No evidence of recurrent respiratory distress rash. Patient requesting discharge home. Patient offered resources for detox.  Patient was able to tolerate PO and was able to ambulate with a steady gait.  Willy EddyPatrick Lenard Kampf, MD 09/05 1929     ____________________________________________   FINAL CLINICAL IMPRESSION(S) / ED DIAGNOSES  Final diagnoses:  Heroin overdose, accidental or unintentional, initial encounter      NEW MEDICATIONS STARTED DURING THIS VISIT:  New Prescriptions   No medications on file     Note:  This document was prepared using Dragon voice recognition software and may include unintentional dictation errors.    Willy EddyPatrick Aayushi Solorzano, MD 02/27/16 [phone]

## 2017-10-11 ENCOUNTER — Emergency Department
Admission: EM | Admit: 2017-10-11 | Discharge: 2017-10-11 | Disposition: A | Discharge status: Home / Self Care | Payer: Self-pay | Attending: Emergency Medicine | Admitting: Emergency Medicine

## 2017-10-11 ENCOUNTER — Encounter: Payer: Self-pay | Admitting: Emergency Medicine

## 2017-10-11 ENCOUNTER — Other Ambulatory Visit: Payer: Self-pay

## 2017-10-11 DIAGNOSIS — Z79899 Other long term (current) drug therapy: Secondary | ICD-10-CM | POA: Insufficient documentation

## 2017-10-11 DIAGNOSIS — F17210 Nicotine dependence, cigarettes, uncomplicated: Secondary | ICD-10-CM | POA: Insufficient documentation

## 2017-10-11 DIAGNOSIS — K4090 Unilateral inguinal hernia, without obstruction or gangrene, not specified as recurrent: Secondary | ICD-10-CM | POA: Insufficient documentation

## 2017-10-11 LAB — COMPREHENSIVE METABOLIC PANEL
ALT: 32 U/L (ref 17–63)
ANION GAP: 5 (ref 5–15)
AST: 28 U/L (ref 15–41)
Albumin: 4 g/dL (ref 3.5–5.0)
Alkaline Phosphatase: 40 U/L (ref 38–126)
BUN: 10 mg/dL (ref 6–20)
CALCIUM: 8.7 mg/dL — AB (ref 8.9–10.3)
CHLORIDE: 103 mmol/L (ref 101–111)
CO2: 29 mmol/L (ref 22–32)
Creatinine, Ser: 0.86 mg/dL (ref 0.61–1.24)
GFR calc non Af Amer: >60 mL/min (ref >60)
Glucose, Bld: 112 mg/dL — ABNORMAL HIGH (ref 65–99)
POTASSIUM: 3.8 mmol/L (ref 3.5–5.1)
SODIUM: 137 mmol/L (ref 135–145)
Total Bilirubin: 1 mg/dL (ref 0.3–1.2)
Total Protein: 7.3 g/dL (ref 6.5–8.1)

## 2017-10-11 LAB — CBC WITH DIFFERENTIAL/PLATELET
BASOS PCT: 0 %
Basophils Absolute: 0 10*3/uL (ref 0–0.1)
Eosinophils Absolute: 0.1 10*3/uL (ref 0–0.7)
Eosinophils Relative: 1 %
HEMATOCRIT: 43.4 % (ref 40.0–52.0)
HEMOGLOBIN: 15.1 g/dL (ref 13.0–18.0)
LYMPHS ABS: 1.1 10*3/uL (ref 1.0–3.6)
Lymphocytes Relative: 14 %
MCH: 32 pg (ref 26.0–34.0)
MCHC: 34.8 g/dL (ref 32.0–36.0)
MCV: 92 fL (ref 80.0–100.0)
MONOS PCT: 9 %
Monocytes Absolute: 0.7 10*3/uL (ref 0.2–1.0)
NEUTROS ABS: 5.7 10*3/uL (ref 1.4–6.5)
NEUTROS PCT: 76 %
Platelets: 235 10*3/uL (ref 150–440)
RBC: 4.71 MIL/uL (ref 4.40–5.90)
RDW: 13.7 % (ref 11.5–14.5)
WBC: 7.6 10*3/uL (ref 3.8–10.6)

## 2017-10-11 LAB — LIPASE, BLOOD: Lipase: 20 U/L (ref 11–51)

## 2017-10-11 LAB — LACTIC ACID, PLASMA: LACTIC ACID, VENOUS: 0.8 mmol/L (ref 0.5–1.9)

## 2017-10-11 MED ORDER — OXYCODONE-ACETAMINOPHEN 5-325 MG PO TABS
1.0000 | ORAL_TABLET | Freq: Once | ORAL | Status: AC
  Administered 2017-10-11: 1 via ORAL
  Filled 2017-10-11: qty 1

## 2017-10-11 MED ORDER — IBUPROFEN 600 MG PO TABS: 600.0000 mg | ORAL_TABLET | Freq: Four times a day (QID) | ORAL | 0 refills | Status: AC | PRN

## 2017-10-11 MED ORDER — OXYCODONE-ACETAMINOPHEN 5-325 MG PO TABS: 1.0000 | ORAL_TABLET | Freq: Four times a day (QID) | ORAL | 0 refills | Status: AC | PRN

## 2017-10-11 NOTE — ED Notes (Addendum)
Pt c/o right inguinal hernia that he has not been able to reduce for the past 3 days , states he has having some pain at the hernia site. States he has a hx of inguinal hernia repair of the left. Denies any difficulty urinating, No N/V/D/fever..Marland Kitchen

## 2017-10-11 NOTE — Discharge Instructions (Signed)
Take the ibuprofen as the primary medication for pain every 6 hours, and you may take the Percocet as needed for more severe pain.  You should try to take it as little as possible.  Call Dr. Jinny Sandersavis's office on Monday to arrange for follow-up on Wednesday or Thursday.  Let them know they were seen in the emergency department.  Return to the ER for new, worsening, persistent severe pain, if the hernia pops out and cannot be pushed back in, vomiting, not passing gas, constipation, or any other new or worsening symptoms that concern you.

## 2017-10-11 NOTE — ED Provider Notes (Signed)
[facility] Emergency Department Provider Note ____________________________________________   First MD Initiated Contact with Patient 10/11/17 1030     (approximate)  I have reviewed the triage vital signs and the nursing notes.   HISTORY  Chief Complaint Inguinal Hernia    HPI Gregory Collier is a 42 y.o. male with PMH as noted below who presents with right inguinal pain, chronic for the last several months but worse over the last week, and associated with worsening swelling to the area especially when he stands up or exerts himself in any way.  He denies nausea, vomiting, constipation, or obstipation.  He denies any urinary symptoms or testicular pain.  No recent trauma.  The patient also reports that he has had some bright red blood in the stool over the last week.  Past Medical History:  Diagnosis Date  . Hepatitis C     Patient Active Problem List   Diagnosis Date Noted  . Septic arthritis (HCC) 02/02/2016    Past Surgical History:  Procedure Laterality Date  . ABDOMINAL SURGERY    . HERNIA REPAIR      Prior to Admission medications   Medication Sig Start Date End Date Taking? Authorizing Provider  carisoprodol (SOMA) 350 MG tablet Take 1 tablet (350 mg total) by mouth 3 (three) times daily as needed for muscle spasms. Patient not taking: Reported on 02/02/2016 04/23/15   Myrna Blazer, MD  ibuprofen (ADVIL,MOTRIN) 600 MG tablet Take 1 tablet (600 mg total) by mouth every 6 (six) hours as needed. 10/11/17   Dionne Bucy, MD  meloxicam (MOBIC) 15 MG tablet Take 1 tablet (15 mg total) by mouth daily. Patient not taking: Reported on 10/11/2017 01/17/16   Bethel Born, PA-C  naloxone HCl 4 MG/0.1ML LIQD Apply to both nostrils if having decreased breathing after heroin use. Patient not taking: Reported on 10/11/2017 02/27/16   Willy Eddy, MD  oxyCODONE 10 MG TABS Take 1 tablet (10 mg total) by mouth every 4 (four)  hours as needed for moderate pain or breakthrough pain. Patient not taking: Reported on 10/11/2017 02/03/16   Auburn Bilberry, MD  oxyCODONE-acetaminophen (PERCOCET) 5-325 MG tablet Take 1 tablet by mouth every 6 (six) hours as needed for up to 5 days for severe pain. 10/11/17 10/16/17  Dionne Bucy, MD    Allergies Patient has no known allergies.  No family history on file.  Social History Social History   Tobacco Use  . Smoking status: Current Every Day Smoker    Packs/day: 3.00    Years: 0.00    Pack years: 0.00    Types: Cigarettes  . Smokeless tobacco: Never Used  Substance Use Topics  . Alcohol use: No  . Drug use: Yes    Types: Heroin    Comment: HEROIN ADDICTION    Review of Systems  Constitutional: No fever. Eyes: No redness. ENT: No sore throat. Cardiovascular: Denies chest pain. Respiratory: Denies shortness of breath. Gastrointestinal: No vomiting.  No diarrhea.  Genitourinary: Negative for dysuria.  Musculoskeletal: Negative for back pain. Skin: Negative for rash. Neurological: Negative for headache.   ____________________________________________   PHYSICAL EXAM:  VITAL SIGNS: ED Triage Vitals  Enc Vitals Group     BP 10/11/17 1016 136/81     Pulse Rate 10/11/17 1016 71     Resp 10/11/17 1016 20     Temp 10/11/17 1016 98.1 F (36.7 C)     Temp Source 10/11/17 1016 Oral     SpO2  10/11/17 1016 96 %     Weight 10/11/17 1017 165 lb (74.8 kg)     Height 10/11/17 1017 5\' 10"  (1.778 m)     Head Circumference --      Peak Flow --      Pain Score 10/11/17 1017 7     Pain Loc --      Pain Edu? --      Excl. in GC? --     Constitutional: Alert and oriented. Well appearing and in no acute distress. Eyes: Conjunctivae are normal.  No scleral icterus. Head: Atraumatic. Nose: No congestion/rhinnorhea. Mouth/Throat: Mucous membranes are moist.   Neck: Normal range of motion.  Cardiovascular:  Good peripheral circulation. Respiratory: Normal  respiratory effort.  Gastrointestinal: Soft and nontender. No distention.  Genitourinary: Mild right inguinal tenderness but no palpable hernia or mass. Musculoskeletal:  Extremities warm and well perfused.  Neurologic:  Normal speech and language. No gross focal neurologic deficits are appreciated.  Skin:  Skin is warm and dry. No rash noted. Psychiatric: Mood and affect are normal. Speech and behavior are normal.  ____________________________________________   LABS (all labs ordered are listed, but only abnormal results are displayed)  Labs Reviewed  COMPREHENSIVE METABOLIC PANEL - Abnormal; Notable for the following components:      Result Value   Glucose, Bld 112 (*)    Calcium 8.7 (*)    All other components within normal limits  CBC WITH DIFFERENTIAL/PLATELET  LIPASE, BLOOD  LACTIC ACID, PLASMA  LACTIC ACID, PLASMA   ____________________________________________  EKG   ____________________________________________  RADIOLOGY    ____________________________________________   PROCEDURES  Procedure(s) performed: No  Procedures  Critical Care performed: No ____________________________________________   INITIAL IMPRESSION / ASSESSMENT AND PLAN / ED COURSE  Pertinent labs & imaging results that were available during my care of the patient were reviewed by me and considered in my medical decision making (see chart for details).  42 year old male with PMH as noted above presents with pain from chronic right inguinal hernia which has been present for months but with worsening pain and distention over the last few weeks.  Patient states that the stents whenever he stands or exerts himself.  On exam currently, the patient is well-appearing, vital signs are normal, and when he is lying flat the hernia is not palpable.  There is no evidence of incarceration or strangulation.  Patient has no obstructive symptoms or tenderness elsewhere in the abdomen.  Presentation is  consistent with uncomplicated inguinal hernia.  Plan: Analgesia, labs, and I will contact surgery to arrange for close follow-up.  Patient's description of small amount of bright red blood during bowel movements is most consistent with hemorrhoid.  Patient declines rectal exam at this time, but this can be addressed when he follows up with surgery.  There is no evidence of active GI bleed.  ----------------------------------------- 12:23 PM on 10/11/2017 -----------------------------------------  Lab work-up is unremarkable.  Patient is comfortable after pain medication.  The hernia has not recurred.  I consulted Dr. Earlene Plateravis from surgery, who agrees with outpatient management.  He kindly offered to have the patient follow-up with him later this week, and states the patient can call his office on Monday.  Eyelid the patient know about the results of the work-up, and my conversation with Dr. Earlene Plateravis.  He understands the follow-up plan.  I see from patient's prior records that he has a history of heroin abuse.  We extensively discussed pain management.  The patient states that he  is not an active substance abuser at this time and there does not appear to be a contraindication to opiate analgesia.  I will provide a prescription for small quantity of Percocet for patient to take until he is able to follow-up.  At this time the patient does not appear to be at high risk for abuse.  I extensively counseled the patient on the risk of dependence and abuse, and I instructed him to take ibuprofen as the primary pain medication and the Percocet only as needed for more severe pain.  Patient agrees with this plan.  Return precautions given, and he expresses understanding.      ____________________________________________   FINAL CLINICAL IMPRESSION(S) / ED DIAGNOSES  Final diagnoses:  Unilateral inguinal hernia without obstruction or gangrene, recurrence not specified      NEW MEDICATIONS STARTED DURING  THIS VISIT:  New Prescriptions   IBUPROFEN (ADVIL,MOTRIN) 600 MG TABLET    Take 1 tablet (600 mg total) by mouth every 6 (six) hours as needed.   OXYCODONE-ACETAMINOPHEN (PERCOCET) 5-325 MG TABLET    Take 1 tablet by mouth every 6 (six) hours as needed for up to 5 days for severe pain.     Note:  This document was prepared using Dragon voice recognition software and may include unintentional dictation errors.    Dionne Bucy, MD 10/11/17 1226

## 2017-10-11 NOTE — ED Triage Notes (Signed)
Unable to reduce R inguinal hernia x 3 days.

## 2017-10-15 ENCOUNTER — Ambulatory Visit
Admit: 2017-10-15 | Discharge: 2017-10-16 | Discharge status: Against Medical Advice | Payer: MEDICAID | Attending: Emergency Medicine

## 2017-10-15 ENCOUNTER — Emergency Department
Admit: 2017-10-15 | Discharge: 2017-10-16 | Discharge status: Against Medical Advice | Payer: MEDICAID | Attending: Emergency Medicine

## 2017-10-15 DIAGNOSIS — R1013 Epigastric pain: Principal | ICD-10-CM

## 2017-10-21 ENCOUNTER — Telehealth: Payer: Self-pay | Admitting: Surgery

## 2017-10-21 NOTE — Telephone Encounter (Signed)
-----   Message from Nicole Kindred sent at 10/17/2017  3:09 PM EDT ----- Regarding: ED New patient-follow up Pt seen in ED for Unilateral inguinal hernia. New patient-referred to Dr Earlene Plater.

## 2017-10-21 NOTE — Telephone Encounter (Signed)
Left a message for the patient to call the office to schedule an appointment from when he was seen in the ED.

## 2017-10-28 NOTE — Telephone Encounter (Signed)
Patients coming in on 11/07/17 to see Dr. Aleen Campi

## 2017-11-07 ENCOUNTER — Ambulatory Visit: Payer: Self-pay | Admitting: Surgery

## 2017-11-10 ENCOUNTER — Emergency Department (HOSPITAL_COMMUNITY)
Admission: EM | Admit: 2017-11-10 | Discharge: 2017-11-10 | Discharge status: Against Medical Advice | Payer: Self-pay | Attending: Emergency Medicine | Admitting: Emergency Medicine

## 2017-11-10 DIAGNOSIS — F17210 Nicotine dependence, cigarettes, uncomplicated: Secondary | ICD-10-CM | POA: Insufficient documentation

## 2017-11-10 DIAGNOSIS — T507X1A Poisoning by analeptics and opioid receptor antagonists, accidental (unintentional), initial encounter: Secondary | ICD-10-CM | POA: Insufficient documentation

## 2017-11-10 DIAGNOSIS — T40601A Poisoning by unspecified narcotics, accidental (unintentional), initial encounter: Secondary | ICD-10-CM

## 2017-11-10 NOTE — Discharge Instructions (Addendum)
Return to the ED if you change your mind about being evaluated

## 2017-11-10 NOTE — ED Provider Notes (Signed)
[facility]-EMERGENCY DEPT Provider Note   CSN: [id number] Arrival date & time: 11/10/17  1220     History   Chief Complaint Chief Complaint  Patient presents with  . Drug Overdose    HPI Gregory Collier is a 42 y.o. male.  HPI Patient presented to the emergency room after being found in the street supine with altered mental status and altered breathing.  According to the EMS report the girlfriend told EMS that he took Percocet.  Patient in the emergency room is awake and alert.  Patient states he has been dealing with trouble associated with an inguinal hernia.  He was having pain so he took some of his Percocet and then took a few more.  Patient denies attempting to overdose.  He denies any suicidal or homicidal ideation.  Patient denies any fevers or chills.  Patient states he does not want to be seen here for evaluation.  He wants to check on his girlfriend. Past Medical History:  Diagnosis Date  . Hepatitis C     Patient Active Problem List   Diagnosis Date Noted  . Septic arthritis (HCC) 02/02/2016    Past Surgical History:  Procedure Laterality Date  . ABDOMINAL SURGERY    . HERNIA REPAIR          Home Medications    Prior to Admission medications   Medication Sig Start Date End Date Taking? Authorizing Provider  carisoprodol (SOMA) 350 MG tablet Take 1 tablet (350 mg total) by mouth 3 (three) times daily as needed for muscle spasms. Patient not taking: Reported on 02/02/2016 04/23/15   Myrna Blazer, MD  ibuprofen (ADVIL,MOTRIN) 600 MG tablet Take 1 tablet (600 mg total) by mouth every 6 (six) hours as needed. 10/11/17   Dionne Bucy, MD  meloxicam (MOBIC) 15 MG tablet Take 1 tablet (15 mg total) by mouth daily. Patient not taking: Reported on 10/11/2017 01/17/16   Bethel Born, PA-C  naloxone HCl 4 MG/0.1ML LIQD Apply to both nostrils if having decreased breathing after heroin use. Patient not taking: Reported on  10/11/2017 02/27/16   Willy Eddy, MD  oxyCODONE 10 MG TABS Take 1 tablet (10 mg total) by mouth every 4 (four) hours as needed for moderate pain or breakthrough pain. Patient not taking: Reported on 10/11/2017 02/03/16   Auburn Bilberry, MD    Family History No family history on file.  Social History Social History   Tobacco Use  . Smoking status: Current Every Day Smoker    Packs/day: 3.00    Years: 0.00    Pack years: 0.00    Types: Cigarettes  . Smokeless tobacco: Never Used  Substance Use Topics  . Alcohol use: No  . Drug use: Yes    Types: Heroin    Comment: HEROIN ADDICTION     Allergies   Patient has no known allergies.   Review of Systems Review of Systems  All other systems reviewed and are negative.    Physical Exam Updated Vital Signs BP 113/73   Pulse 90   Temp 97.9 F (36.6 C) (Oral)   Resp 20   SpO2 99%   Physical Exam  Constitutional: He appears well-developed and well-nourished. No distress.  HENT:  Head: Normocephalic and atraumatic.  Right Ear: External ear normal.  Left Ear: External ear normal.  Eyes: Conjunctivae are normal. Right eye exhibits no discharge. Left eye exhibits no discharge. No scleral icterus.  Neck: Neck supple. No tracheal deviation present.  Cardiovascular: Normal rate.  Pulmonary/Chest: Effort normal. No stridor. No respiratory distress.  Abdominal: He exhibits no distension.  Musculoskeletal: He exhibits no edema.  Neurological: He is alert. Cranial nerve deficit: no gross deficits.  Skin: Skin is warm and dry. No rash noted.  Psychiatric: He has a normal mood and affect.  Nursing note and vitals reviewed.    ED Treatments / Results   Procedures Procedures (including critical care time)  Medications Ordered in ED Medications - No data to display   Initial Impression / Assessment and Plan / ED Course  I have reviewed the triage vital signs and the nursing notes.  Pertinent labs & imaging results that  were available during my care of the patient were reviewed by me and considered in my medical decision making (see chart for details).   Patient did not allow a complete evaluation.  He also did not want any laboratory tests.  I explained to the patient that if he took too many Percocet that he may have taken too much Tylenol which could have serious effects.  I explained to the Tylenol could cause liver failure and ultimately death in severe cases.  Patient states he did not take that much.  He feels fine and he does not want to be seen.  The patient is awake and alert.  He does not appear to have any suicidal or homicidal ideation.  Reviewed his records and the patient does have a history of opiate abuse in the past.  Pt will leave AMA  Final Clinical Impressions(s) / ED Diagnoses   Final diagnoses:  Opiate overdose, accidental or unintentional, initial encounter [facility])    ED Discharge Orders    None       Linwood Dibbles, MD 11/10/17 1251

## 2017-11-10 NOTE — ED Notes (Signed)
Patient requesting to leave AMA at this time, MD has explained risks to patient and patient insists upon leaving. Clean scrubs provided for patient due to incontinence, no further acute needs identified. Patient being discharged with self. Patient seen walking out of department by multiple staff members.

## 2017-11-10 NOTE — ED Notes (Signed)
Patient expressing that he wants to leave the emergency department at this time, provider and charge nurse aware.

## 2017-11-10 NOTE — ED Notes (Signed)
ED Provider at bedside. 

## 2017-11-10 NOTE — ED Triage Notes (Signed)
Transported by [facility]--found in the street supine with agonal breathing. Pt's girlfriend reported to EMS that patient only took Percocet (unknown dosage or amount). EMS administered 1 mg of Narcan IV, pt very lethargic upon arrival.

## 2017-11-12 ENCOUNTER — Ambulatory Visit: Payer: Self-pay | Admitting: Surgery

## 2017-11-13 ENCOUNTER — Encounter (HOSPITAL_COMMUNITY): Payer: Self-pay | Admitting: Emergency Medicine

## 2017-11-13 ENCOUNTER — Emergency Department (HOSPITAL_COMMUNITY): Payer: Self-pay

## 2017-11-13 ENCOUNTER — Emergency Department (HOSPITAL_COMMUNITY)
Admission: EM | Admit: 2017-11-13 | Discharge: 2017-11-13 | Disposition: A | Discharge status: Home / Self Care | Payer: Self-pay | Attending: Emergency Medicine | Admitting: Emergency Medicine

## 2017-11-13 DIAGNOSIS — Y929 Unspecified place or not applicable: Secondary | ICD-10-CM | POA: Insufficient documentation

## 2017-11-13 DIAGNOSIS — R0602 Shortness of breath: Secondary | ICD-10-CM | POA: Insufficient documentation

## 2017-11-13 DIAGNOSIS — Z79899 Other long term (current) drug therapy: Secondary | ICD-10-CM | POA: Insufficient documentation

## 2017-11-13 DIAGNOSIS — R42 Dizziness and giddiness: Secondary | ICD-10-CM | POA: Insufficient documentation

## 2017-11-13 DIAGNOSIS — L089 Local infection of the skin and subcutaneous tissue, unspecified: Secondary | ICD-10-CM

## 2017-11-13 DIAGNOSIS — T148XXA Other injury of unspecified body region, initial encounter: Secondary | ICD-10-CM

## 2017-11-13 DIAGNOSIS — Y939 Activity, unspecified: Secondary | ICD-10-CM | POA: Insufficient documentation

## 2017-11-13 DIAGNOSIS — Y999 Unspecified external cause status: Secondary | ICD-10-CM | POA: Insufficient documentation

## 2017-11-13 DIAGNOSIS — T798XXA Other early complications of trauma, initial encounter: Secondary | ICD-10-CM | POA: Insufficient documentation

## 2017-11-13 DIAGNOSIS — W57XXXA Bitten or stung by nonvenomous insect and other nonvenomous arthropods, initial encounter: Secondary | ICD-10-CM | POA: Insufficient documentation

## 2017-11-13 DIAGNOSIS — S30861A Insect bite (nonvenomous) of abdominal wall, initial encounter: Secondary | ICD-10-CM | POA: Insufficient documentation

## 2017-11-13 DIAGNOSIS — F17210 Nicotine dependence, cigarettes, uncomplicated: Secondary | ICD-10-CM | POA: Insufficient documentation

## 2017-11-13 DIAGNOSIS — W312XXA Contact with powered woodworking and forming machines, initial encounter: Secondary | ICD-10-CM | POA: Insufficient documentation

## 2017-11-13 DIAGNOSIS — S61511A Laceration without foreign body of right wrist, initial encounter: Secondary | ICD-10-CM | POA: Insufficient documentation

## 2017-11-13 DIAGNOSIS — L988 Other specified disorders of the skin and subcutaneous tissue: Secondary | ICD-10-CM | POA: Insufficient documentation

## 2017-11-13 DIAGNOSIS — K4090 Unilateral inguinal hernia, without obstruction or gangrene, not specified as recurrent: Secondary | ICD-10-CM | POA: Insufficient documentation

## 2017-11-13 LAB — COMPREHENSIVE METABOLIC PANEL
ALT: 40 U/L (ref 17–63)
AST: 44 U/L — ABNORMAL HIGH (ref 15–41)
Albumin: 3.4 g/dL — ABNORMAL LOW (ref 3.5–5.0)
Alkaline Phosphatase: 35 U/L — ABNORMAL LOW (ref 38–126)
Anion gap: 6 (ref 5–15)
BILIRUBIN TOTAL: 0.8 mg/dL (ref 0.3–1.2)
BUN: 7 mg/dL (ref 6–20)
CALCIUM: 8.8 mg/dL — AB (ref 8.9–10.3)
CHLORIDE: 103 mmol/L (ref 101–111)
CO2: 28 mmol/L (ref 22–32)
CREATININE: 0.88 mg/dL (ref 0.61–1.24)
Glucose, Bld: 114 mg/dL — ABNORMAL HIGH (ref 65–99)
Potassium: 4.7 mmol/L (ref 3.5–5.1)
Sodium: 137 mmol/L (ref 135–145)
TOTAL PROTEIN: 6.5 g/dL (ref 6.5–8.1)

## 2017-11-13 LAB — I-STAT CG4 LACTIC ACID, ED
Lactic Acid, Venous: 1.05 mmol/L (ref 0.5–1.9)
Lactic Acid, Venous: 0.68 mmol/L (ref 0.5–1.9)

## 2017-11-13 LAB — TROPONIN I: Troponin I: <0.03 ng/mL (ref <0.03)

## 2017-11-13 LAB — CBC WITH DIFFERENTIAL/PLATELET
Abs Immature Granulocytes: 0 10*3/uL (ref 0.0–0.1)
BASOS ABS: 0 10*3/uL (ref 0.0–0.1)
BASOS PCT: 0 %
EOS ABS: 0.1 10*3/uL (ref 0.0–0.7)
EOS PCT: 2 %
HCT: 43 % (ref 39.0–52.0)
Hemoglobin: 14.5 g/dL (ref 13.0–17.0)
Immature Granulocytes: 0 %
Lymphocytes Relative: 11 %
Lymphs Abs: 0.8 10*3/uL (ref 0.7–4.0)
MCH: 30.7 pg (ref 26.0–34.0)
MCHC: 33.7 g/dL (ref 30.0–36.0)
MCV: 91.1 fL (ref 78.0–100.0)
MONO ABS: 0.8 10*3/uL (ref 0.1–1.0)
Monocytes Relative: 11 %
NEUTROS ABS: 5.3 10*3/uL (ref 1.7–7.7)
Neutrophils Relative %: 76 %
PLATELETS: 210 10*3/uL (ref 150–400)
RBC: 4.72 MIL/uL (ref 4.22–5.81)
RDW: 12.5 % (ref 11.5–15.5)
WBC: 7 10*3/uL (ref 4.0–10.5)

## 2017-11-13 LAB — D-DIMER, QUANTITATIVE (NOT AT ARMC): D DIMER QUANT: 0.44 ug{FEU}/mL (ref 0.00–0.50)

## 2017-11-13 LAB — URINALYSIS, ROUTINE W REFLEX MICROSCOPIC
BILIRUBIN URINE: NEGATIVE
Glucose, UA: NEGATIVE mg/dL
Hgb urine dipstick: NEGATIVE
KETONES UR: 5 mg/dL — AB
Leukocytes, UA: NEGATIVE
NITRITE: NEGATIVE
PROTEIN: NEGATIVE mg/dL
SPECIFIC GRAVITY, URINE: 1.02 (ref 1.005–1.030)
pH: 8 (ref 5.0–8.0)

## 2017-11-13 LAB — LIPASE, BLOOD: LIPASE: 29 U/L (ref 11–51)

## 2017-11-13 MED ORDER — DOXYCYCLINE HYCLATE 100 MG PO TABS
100.0000 mg | ORAL_TABLET | Freq: Once | ORAL | Status: AC
  Administered 2017-11-13: 100 mg via ORAL
  Filled 2017-11-13: qty 1

## 2017-11-13 MED ORDER — DIPHENHYDRAMINE HCL 25 MG PO CAPS
50.0000 mg | ORAL_CAPSULE | Freq: Once | ORAL | Status: AC
  Administered 2017-11-13: 50 mg via ORAL
  Filled 2017-11-13: qty 2

## 2017-11-13 MED ORDER — DOXYCYCLINE HYCLATE 100 MG PO CAPS: 100.0000 mg | ORAL_CAPSULE | Freq: Two times a day (BID) | ORAL | 0 refills | Status: AC

## 2017-11-13 MED ORDER — IBUPROFEN 400 MG PO TABS
600.0000 mg | ORAL_TABLET | Freq: Once | ORAL | Status: AC
  Administered 2017-11-13: 600 mg via ORAL
  Filled 2017-11-13: qty 1

## 2017-11-13 NOTE — ED Triage Notes (Signed)
Patient complains of a laceration to right wrist, approximately 2 cm, inflicted 3 days ago with a circular saw. Bleeding controlled, skin around wound is red, swollen, and tender. Patient states he has not performed and care on the wound nor bandaged it since it occurred.  Patient also complains of a hernia that he was told he had a few years ago that was non-urgent. Patient states approximately one month ago he now has a lot of extra tissue in his scrotum and he has a lot of pain in his scrotum.  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 Patient states he believes there were bed bugs in the home and states he now sees them where he lives now.  Patient also complains of intermittent shortness of breath that started two weeks ago, patient states he does not notice any specific activity that seems to bring on the shortness of breath.

## 2017-11-13 NOTE — ED Notes (Signed)
Meal given-MD aware 

## 2017-11-13 NOTE — ED Notes (Signed)
D/c reviewed with patient 

## 2017-11-13 NOTE — ED Notes (Signed)
Warm pack and disposable shirt given as requested

## 2017-11-13 NOTE — ED Provider Notes (Signed)
[facility] EMERGENCY DEPARTMENT Provider Note   CSN: [id number] Arrival date & time: 11/13/17  1131     History   Chief Complaint Chief Complaint  Patient presents with  . Extremity Laceration    Gregory Collier is a 42 y.o. male.  Gregory   42 year old male presents with multiple complaints.  His chief complaint is an injury to his wrist.  He states 3 days ago he injured himself with a saw.  Patient states he cleaned it initially but otherwise no further wound care.  It is now painful starting today and has been red with some green/yellow discharge.  No fevers.  It is painful to move his wrist.  The patient also complains of on and off shortness of breath with dizziness.  Has been ongoing for about a week.  Sometimes will get chest pain with it.  Currently asymptomatic.  Some on and off cough.  He also complains of bug bites and is concerned about bedbugs.  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  He states he is cleaned out his camper but still seems to be developing new bites.  It is very itchy.  Finally he also complains of a hernia that has had for quite some time.  He states it seems to be getting bigger and the swelling goes into his right testicle.  Currently at rest he has no swelling or pain.  No vomiting or abdominal pain.  Past Medical History:  Diagnosis Date  . Hepatitis C     Patient Active Problem List   Diagnosis Date Noted  . Septic arthritis (HCC) 02/02/2016    Past Surgical History:  Procedure Laterality Date  . ABDOMINAL SURGERY    . HERNIA REPAIR          Home Medications    Prior to Admission medications   Medication Sig Start Date End Date Taking? Authorizing Provider  carisoprodol (SOMA) 350 MG tablet Take 1 tablet (350 mg total) by mouth 3 (three) times daily as needed for muscle spasms. Patient not taking: Reported on 02/02/2016 04/23/15   Myrna Blazer, MD    doxycycline (VIBRAMYCIN) 100 MG capsule Take 1 capsule (100 mg total) by mouth 2 (two) times daily for 5 days. 11/13/17 11/18/17  Pricilla Loveless, MD  ibuprofen (ADVIL,MOTRIN) 600 MG tablet Take 1 tablet (600 mg total) by mouth every 6 (six) hours as needed. 10/11/17   Dionne Bucy, MD  meloxicam (MOBIC) 15 MG tablet Take 1 tablet (15 mg total) by mouth daily. Patient not taking: Reported on 10/11/2017 01/17/16   Bethel Born, PA-C  naloxone HCl 4 MG/0.1ML LIQD Apply to both nostrils if having decreased breathing after heroin use. Patient not taking: Reported on 10/11/2017 02/27/16   Willy Eddy, MD  oxyCODONE 10 MG TABS Take 1 tablet (10 mg total) by mouth every 4 (four) hours as needed for moderate pain or breakthrough pain. Patient not taking: Reported on 10/11/2017 02/03/16   Auburn Bilberry, MD    Family History No family history on file.  Social History Social History   Tobacco Use  . Smoking status: Current Every Day Smoker    Packs/day: 3.00    Years: 0.00    Pack years: 0.00    Types: Cigarettes  . Smokeless tobacco: Never Used  Substance Use Topics  . Alcohol use: No  . Drug use: Yes    Types: Heroin  Comment: HEROIN ADDICTION     Allergies   Patient has no known allergies.   Review of Systems Review of Systems  Constitutional: Negative for fever.  Respiratory: Positive for shortness of breath.   Cardiovascular: Positive for chest pain.  Gastrointestinal: Negative for abdominal pain and vomiting.  Musculoskeletal: Positive for arthralgias.  Skin: Positive for color change, rash and wound.  Neurological: Positive for dizziness.  All other systems reviewed and are negative.    Physical Exam Updated Vital Signs BP 125/70 (BP Location: Right Arm)   Pulse 68   Temp 98.3 F (36.8 C) (Oral)   Resp 16   SpO2 100%   Physical Exam  Constitutional: He is oriented to person, place, and time. He appears well-developed and well-nourished. No  distress.  HENT:  Head: Normocephalic and atraumatic.  Right Ear: External ear normal.  Left Ear: External ear normal.  Nose: Nose normal.  Eyes: Right eye exhibits no discharge. Left eye exhibits no discharge.  Neck: Neck supple.  Cardiovascular: Normal rate, regular rhythm, normal heart sounds and intact distal pulses.  Pulmonary/Chest: Effort normal and breath sounds normal. He has no wheezes. He has no rales.  Abdominal: Soft. There is no tenderness.  Musculoskeletal: He exhibits no edema.  Small, ~1-2 cm wound to right ulnar wrist. No active drainage. Minimal surrounding redness. Painful to move wrist. No joint effusion  Neurological: He is alert and oriented to person, place, and time.  Skin: Skin is warm and dry. Rash (diffuse flat scabs, mostly over trunk and extremities c/w prior insect bites) noted. He is not diaphoretic. There is erythema.  Nursing note and vitals reviewed.      ED Treatments / Results  Labs (all labs ordered are listed, but only abnormal results are displayed) Labs Reviewed  COMPREHENSIVE METABOLIC PANEL - Abnormal; Notable for the following components:      Result Value   Glucose, Bld 114 (*)    Calcium 8.8 (*)    Albumin 3.4 (*)    AST 44 (*)    Alkaline Phosphatase 35 (*)    All other components within normal limits  URINALYSIS, ROUTINE W REFLEX MICROSCOPIC - Abnormal; Notable for the following components:   APPearance CLOUDY (*)    Ketones, ur 5 (*)    All other components within normal limits  LIPASE, BLOOD  CBC WITH DIFFERENTIAL/PLATELET  D-DIMER, QUANTITATIVE (NOT AT [facility])  TROPONIN I  I-STAT CG4 LACTIC ACID, ED  I-STAT CG4 LACTIC ACID, ED    EKG EKG Interpretation  Date/Time:  Thursday Nov 13 2017 14:57:11 EDT Ventricular Rate:  58 PR Interval:    QRS Duration: 87 QT Interval:  430 QTC Calculation: 423 R Axis:   67 Text Interpretation:  Sinus rhythm rate slower, but otherwise no significant change since 2014 Confirmed by  Pricilla Loveless [phone]) on 11/13/2017 3:59:44 PM   Radiology Dg Chest 2 View  Result Date: 11/13/2017 CLINICAL DATA:  Shortness of breath for 2 weeks, smoker EXAM: CHEST - 2 VIEW COMPARISON:  08/05/2012 FINDINGS: Normal heart size, mediastinal contours, and pulmonary vascularity. Lungs clear. No pleural effusion or pneumothorax. Bones unremarkable. IMPRESSION: Normal exam. Electronically Signed   By: Ulyses Southward M.D.   On: 11/13/2017 12:08   Dg Wrist Complete Right  Result Date: 11/13/2017 CLINICAL DATA:  Recent laceration with saw EXAM: RIGHT WRIST - COMPLETE 3+ VIEW COMPARISON:  None. FINDINGS: Frontal, oblique, lateral, and ulnar deviation scaphoid images were obtained. There is no fracture or dislocation. Joint spaces appear  normal. No erosive change. No radiopaque foreign body or soft tissue air evident. IMPRESSION: No fracture or dislocation. No arthropathy. No soft tissue air or radiopaque foreign body. Electronically Signed   By: Bretta Bang III M.D.   On: 11/13/2017 13:47    Procedures Procedures (including critical care time)  Medications Ordered in ED Medications  doxycycline (VIBRA-TABS) tablet 100 mg (100 mg Oral Given 11/13/17 1327)  ibuprofen (ADVIL,MOTRIN) tablet 600 mg (600 mg Oral Given 11/13/17 1327)  diphenhydrAMINE (BENADRYL) capsule 50 mg (50 mg Oral Given 11/13/17 1332)     Initial Impression / Assessment and Plan / ED Course  I have reviewed the triage vital signs and the nursing notes.  Pertinent labs & imaging results that were available during my care of the patient were reviewed by me and considered in my medical decision making (see chart for details).     Patient's wound is now about 74 days old and with some small signs of infection, will not repair.  He will be started on antibiotics.  I highly doubt deep space infection or septic joint.  He states it hurts to move his wrist but I think is the location more than a deep space infection.  He is able to  move his wrist when asked.  As for his rash, this does appear to be insect bites or stings and we discussed cleaning his house better to help remove possible sources of continued bites.  The hernia is currently asymptomatic and he will be referred to general surgery.  The shortness of breath is very vague and seems to come and go.  Given his drug abuse, d-dimer sent for otherwise low risk PE and is negative.  ECG unremarkable and other labs benign.  Chest x-ray unremarkable.  Discharge home with return precautions.  Final Clinical Impressions(s) / ED Diagnoses   Final diagnoses:  Acute post-traumatic wound infection, initial encounter  Right inguinal hernia  Insect bite, unspecified site, initial encounter    ED Discharge Orders        Ordered    doxycycline (VIBRAMYCIN) 100 MG capsule  2 times daily     11/13/17 1504       Pricilla Loveless, MD 11/13/17 1559

## 2017-11-13 NOTE — ED Notes (Signed)
ED Provider at bedside. 

## 2017-11-13 NOTE — ED Notes (Signed)
Patient transported to X-ray 

## 2017-11-13 NOTE — ED Notes (Signed)
Pt back from X-ray.  

## 2017-11-13 NOTE — ED Notes (Signed)
Warm compress applied to hand 

## 2017-12-23 ENCOUNTER — Emergency Department: Admit: 2017-12-23 | Discharge: 2017-12-23 | Disposition: A | Discharge status: Home / Self Care | Payer: MEDICAID

## 2017-12-23 ENCOUNTER — Ambulatory Visit: Admit: 2017-12-23 | Discharge: 2017-12-23 | Disposition: A | Discharge status: Home / Self Care | Payer: MEDICAID

## 2017-12-23 DIAGNOSIS — R1031 Right lower quadrant pain: Principal | ICD-10-CM

## 2017-12-23 MED ORDER — FAMOTIDINE 20 MG TABLET: 20 mg | tablet | Freq: Two times a day (BID) | 3 refills | 0 days | Status: AC

## 2017-12-23 MED ORDER — OXYCODONE-ACETAMINOPHEN 5 MG-325 MG TABLET
ORAL_TABLET | Freq: Four times a day (QID) | ORAL | 0 refills | 0.00000 days | Status: CP | PRN
Start: 2017-12-23 — End: 2017-12-26

## 2017-12-23 MED ORDER — SUCRALFATE 1 GRAM TABLET: 1 g | tablet | Freq: Four times a day (QID) | 0 refills | 0 days | Status: AC

## 2017-12-23 MED ORDER — SUCRALFATE 1 GRAM TABLET
ORAL_TABLET | Freq: Four times a day (QID) | ORAL | 1 refills | 0.00000 days | Status: CP
Start: 2017-12-23 — End: 2018-02-27

## 2017-12-23 MED ORDER — FAMOTIDINE 20 MG TABLET
Freq: Two times a day (BID) | ORAL | 24 refills | 0.00000 days | Status: CP
Start: 2017-12-23 — End: 2018-02-26

## 2017-12-23 MED FILL — FAMOTIDINE/20MG/TABS: FAMOTIDINE/20MG/TABS | 14 days supply | Qty: 28 | Fill #0

## 2017-12-23 MED FILL — OXYCODONE/APAP/5-325MG/TABS: OXYCODONE/APAP/5-325MG/TABS | 3 days supply | Qty: 12 | Fill #0

## 2017-12-23 MED FILL — SUCRALFATE/1GM/TABS: SUCRALFATE/1GM/TABS | 14 days supply | Qty: 56 | Fill #0

## 2018-01-02 ENCOUNTER — Ambulatory Visit: Admit: 2018-01-02 | Discharge: 2018-01-03

## 2018-01-02 DIAGNOSIS — K4090 Unilateral inguinal hernia, without obstruction or gangrene, not specified as recurrent: Principal | ICD-10-CM

## 2018-01-02 MED ORDER — NICOTINE (POLACRILEX) 2 MG BUCCAL LOZENGE
BUCCAL | 1 refills | 0.00000 days | Status: CP | PRN
Start: 2018-01-02 — End: 2018-01-02

## 2018-01-02 MED ORDER — NICOTINE (POLACRILEX) 2 MG BUCCAL LOZENGE: each | 1 refills | 0 days

## 2018-01-02 MED FILL — NICOTINE 2MG LOZG/2MG MINT/LOZG: NICOTINE 2MG LOZG/2MG MINT/LOZG | 6 days supply | Qty: 72 | Fill #0

## 2018-02-05 DIAGNOSIS — R102 Pelvic and perineal pain: Principal | ICD-10-CM

## 2018-02-06 ENCOUNTER — Encounter: Admit: 2018-02-06 | Discharge: 2018-02-06

## 2018-02-06 ENCOUNTER — Ambulatory Visit: Admit: 2018-02-06 | Discharge: 2018-02-06

## 2018-02-06 DIAGNOSIS — R102 Pelvic and perineal pain: Principal | ICD-10-CM

## 2018-02-06 MED ORDER — OXYCODONE 5 MG TABLET
ORAL_TABLET | ORAL | 0 refills | 0.00000 days | Status: CP | PRN
Start: 2018-02-06 — End: 2018-02-27

## 2018-02-06 MED ORDER — IBUPROFEN 200 MG TABLET
ORAL_TABLET | Freq: Three times a day (TID) | ORAL | 0 refills | 0.00000 days
Start: 2018-02-06 — End: 2018-02-26

## 2018-02-06 MED ORDER — ACETAMINOPHEN 325 MG TABLET
ORAL_TABLET | Freq: Three times a day (TID) | ORAL | 0 refills | 0.00000 days
Start: 2018-02-06 — End: 2018-02-26

## 2018-02-25 ENCOUNTER — Ambulatory Visit: Admit: 2018-02-25 | Discharge: 2018-02-27 | Disposition: A | Discharge status: Home / Self Care | Payer: MEDICAID

## 2018-02-25 DIAGNOSIS — L089 Local infection of the skin and subcutaneous tissue, unspecified: Principal | ICD-10-CM

## 2018-02-26 ENCOUNTER — Encounter: Payer: Self-pay | Admitting: *Deleted

## 2018-02-27 MED ORDER — IBUPROFEN 800 MG TABLET
ORAL_TABLET | Freq: Four times a day (QID) | ORAL | 0 refills | 0 days | Status: CP | PRN
Start: 2018-02-27 — End: ?

## 2018-02-27 MED ORDER — ACETAMINOPHEN 500 MG TABLET
ORAL_TABLET | Freq: Three times a day (TID) | ORAL | 0 refills | 0.00000 days | Status: CP
Start: 2018-02-27 — End: ?

## 2018-02-27 MED ORDER — DOXYCYCLINE HYCLATE 100 MG CAPSULE
ORAL_CAPSULE | Freq: Two times a day (BID) | ORAL | 0 refills | 0 days | Status: CP
Start: 2018-02-27 — End: 2018-03-12

## 2019-04-05 ENCOUNTER — Other Ambulatory Visit: Payer: Self-pay

## 2019-04-05 DIAGNOSIS — Z20822 Contact with and (suspected) exposure to covid-19: Secondary | ICD-10-CM

## 2019-04-06 LAB — NOVEL CORONAVIRUS, NAA: SARS-CoV-2, NAA: NOT DETECTED

## 2020-01-15 ENCOUNTER — Emergency Department
Admission: EM | Admit: 2020-01-15 | Discharge: 2020-01-15 | Disposition: A | Discharge status: Home / Self Care | Payer: Medicaid Other | Attending: Emergency Medicine | Admitting: Emergency Medicine

## 2020-01-15 DIAGNOSIS — L02416 Cutaneous abscess of left lower limb: Secondary | ICD-10-CM | POA: Insufficient documentation

## 2020-01-15 DIAGNOSIS — L0291 Cutaneous abscess, unspecified: Secondary | ICD-10-CM

## 2020-01-15 LAB — RAPID HIV SCREEN (HIV 1/2 AB+AG)
HIV 1/2 Antibodies: NONREACTIVE
HIV-1 P24 Antigen - HIV24: NONREACTIVE

## 2020-01-15 LAB — HIV ANTIBODY (ROUTINE TESTING W REFLEX): HIV Screen 4th Generation wRfx: NONREACTIVE

## 2020-01-15 MED ORDER — MUPIROCIN 2 % EX OINT: TOPICAL_OINTMENT | CUTANEOUS | 0 refills | Status: AC

## 2020-01-15 MED ORDER — LIDOCAINE-EPINEPHRINE 2 %-1:100000 IJ SOLN
20.0000 mL | Freq: Once | INTRAMUSCULAR | Status: AC
  Administered 2020-01-15: 20 mL
  Filled 2020-01-15: qty 1

## 2020-01-15 MED ORDER — SULFAMETHOXAZOLE-TRIMETHOPRIM 800-160 MG PO TABS: 1.0000 | ORAL_TABLET | Freq: Two times a day (BID) | ORAL | 0 refills | Status: AC

## 2020-01-15 NOTE — ED Provider Notes (Signed)
ER Provider Note       Time seen: 8:01 AM    I have reviewed the vital signs and the nursing notes.  HISTORY   Chief Complaint Abscess (Left wrist) and Exposure to STD    HPI Gregory Collier is a 44 y.o. male with a history of hepatitis C who presents today for abscess on his left wrist has been present for 2 weeks and is getting worse.  He is also concerned he may have syphilis because his girlfriend was diagnosed with syphilis.  He denies having any rashes or sores.  Denies fevers, chills or other complaints.  Past Medical History:  Diagnosis Date  . Hepatitis C     Past Surgical History:  Procedure Laterality Date  . ABDOMINAL SURGERY    . HERNIA REPAIR      Allergies Patient has no known allergies.  Review of Systems Constitutional: Negative for fever. Cardiovascular: Negative for chest pain. Respiratory: Negative for shortness of breath. Gastrointestinal: Negative for abdominal pain, vomiting and diarrhea. Musculoskeletal: Negative for back pain. Skin: Positive for abscess on his left wrist Neurological: Negative for headaches, focal weakness or numbness.  All systems negative/normal/unremarkable except as stated in the HPI  ____________________________________________   PHYSICAL EXAM:  VITAL SIGNS: Vitals:   01/15/20 0615  BP: (!) 118/87  Pulse: 72  Resp: 18  Temp: 98.6 F (37 C)  SpO2: 99%    Constitutional: Alert and oriented. Well appearing and in no distress. Eyes: Conjunctivae are normal. Normal extraocular movements. Cardiovascular: Normal rate, regular rhythm. No murmurs, rubs, or gallops. Respiratory: Normal respiratory effort without tachypnea nor retractions. Breath sounds are clear and equal bilaterally. No wheezes/rales/rhonchi. Musculoskeletal: Medial abscess on the vulvar distal forearm approaching the wrist.  Surrounding induration and erythema Neurologic:  Normal speech and language. No gross focal neurologic deficits are  appreciated.  Skin: Skin abscess as noted above Psychiatric: Speech and behavior are normal.  ____________________________________________   .Marland KitchenIncision and Drainage  Date/Time: 01/15/2020 8:03 AM Performed by: Emily Filbert, MD Authorized by: Emily Filbert, MD   Consent:    Consent obtained:  Verbal   Consent given by:  Patient Location:    Type:  Abscess   Location:  Upper extremity   Upper extremity location:  Arm   Arm location:  L lower arm Pre-procedure details:    Skin preparation:  Betadine Anesthesia (see MAR for exact dosages):    Anesthesia method:  Local infiltration   Local anesthetic:  Lidocaine 2% WITH epi Procedure type:    Complexity:  Simple Procedure details:    Incision types:  Single straight   Scalpel blade:  11   Drainage:  Purulent   Wound treatment:  Drain placed   Packing materials:  1/4 in gauze    LABS (pertinent positives/negatives)  Labs Reviewed  RPR  HIV ANTIBODY (ROUTINE TESTING W REFLEX)  RAPID HIV SCREEN (HIV 1/2 AB+AG)   DIFFERENTIAL DIAGNOSIS  Abscess, cellulitis, STI  ASSESSMENT AND PLAN  Skin abscess   Plan: The patient had presented for left wrist skin abscess. Patient's labs were performed to test for syphilis and he will be treated if they are positive.  For the skin abscess he will be treated with oral antibiotics and is cleared for follow-up as needed.  Gregory November MD    Note: This note was generated in part or whole with voice recognition software. Voice recognition is usually quite accurate but there are transcription errors that can and very often  do occur. I apologize for any typographical errors that were not detected and corrected.     Emily Filbert, MD 01/15/20 [phone]

## 2020-01-15 NOTE — ED Notes (Signed)
Dr Williams at bedside 

## 2020-01-15 NOTE — ED Triage Notes (Signed)
Patient to ED for a spider bite on the left wrist that has been present for two weeks and is getting worse. Also states his girlfriend who just had a baby was dialyzing her former boyfriend and contracted syphilis from that; he thinks he needs to be checked for that also.

## 2020-01-15 NOTE — ED Notes (Signed)
Pt left room and went outside- first nurse states he was sitting on the brick wall- Jae Dire, RN states she saw him come back into room

## 2020-01-15 NOTE — ED Notes (Signed)
Suture cart placed at bedside. 

## 2020-01-16 LAB — RPR: RPR Ser Ql: NONREACTIVE

## 2020-01-17 ENCOUNTER — Ambulatory Visit: Payer: Self-pay | Admitting: Family Medicine

## 2020-01-17 ENCOUNTER — Other Ambulatory Visit: Payer: Self-pay

## 2020-01-17 DIAGNOSIS — Z202 Contact with and (suspected) exposure to infections with a predominantly sexual mode of transmission: Secondary | ICD-10-CM

## 2020-01-17 MED ORDER — PENICILLIN G BENZATHINE 2400000 UNIT/4ML IM SUSP
2.4000 10*6.[IU] | Freq: Once | INTRAMUSCULAR | Status: AC
  Administered 2020-01-17: 2400000 [IU] via INTRAMUSCULAR

## 2020-01-17 NOTE — Progress Notes (Signed)
Patient reported as contact to syphilis and referred to [facility] from  [facility].  This patient was not seen at [facility].    Patient's partner has been noncompliant with [employer] and this RN was called to assist with treatment. Field visit to the [facility] was completed by this RN and [facility] for partner.  This patient was with the other partner of concern.  This RN was accompanied by [facility].  This patient was  detained and transported to the [facility] by [facility] for a outstanding warrant.   This RN explained to the patient of the need for treatment as a contact before he was transported and that treatment would be provided at the DC.  Patient was tested at [facility] on 01/15/20 with non reactive RPR.   Patient denies any allergies.  Patient was treated with 2.4 million units of Bicilin IM per standing order.   RN explained need for additional testing for infection.   Patient verbalized understanding of need additional screening.  Patient reports that other testing was completed at the same time as the RPR.  RN gave contact number to patient to call if he has questions or concerns one out of detention center.   Wendi Snipes, RN

## 2020-02-23 IMAGING — CR DG WRIST COMPLETE 3+V*R*
4 series · 4 of 4 positions shown · non-contrast
Comparison: None.

CLINICAL DATA: Recent laceration with saw

EXAM:
RIGHT WRIST - COMPLETE 3+ VIEW

[wrist pa]
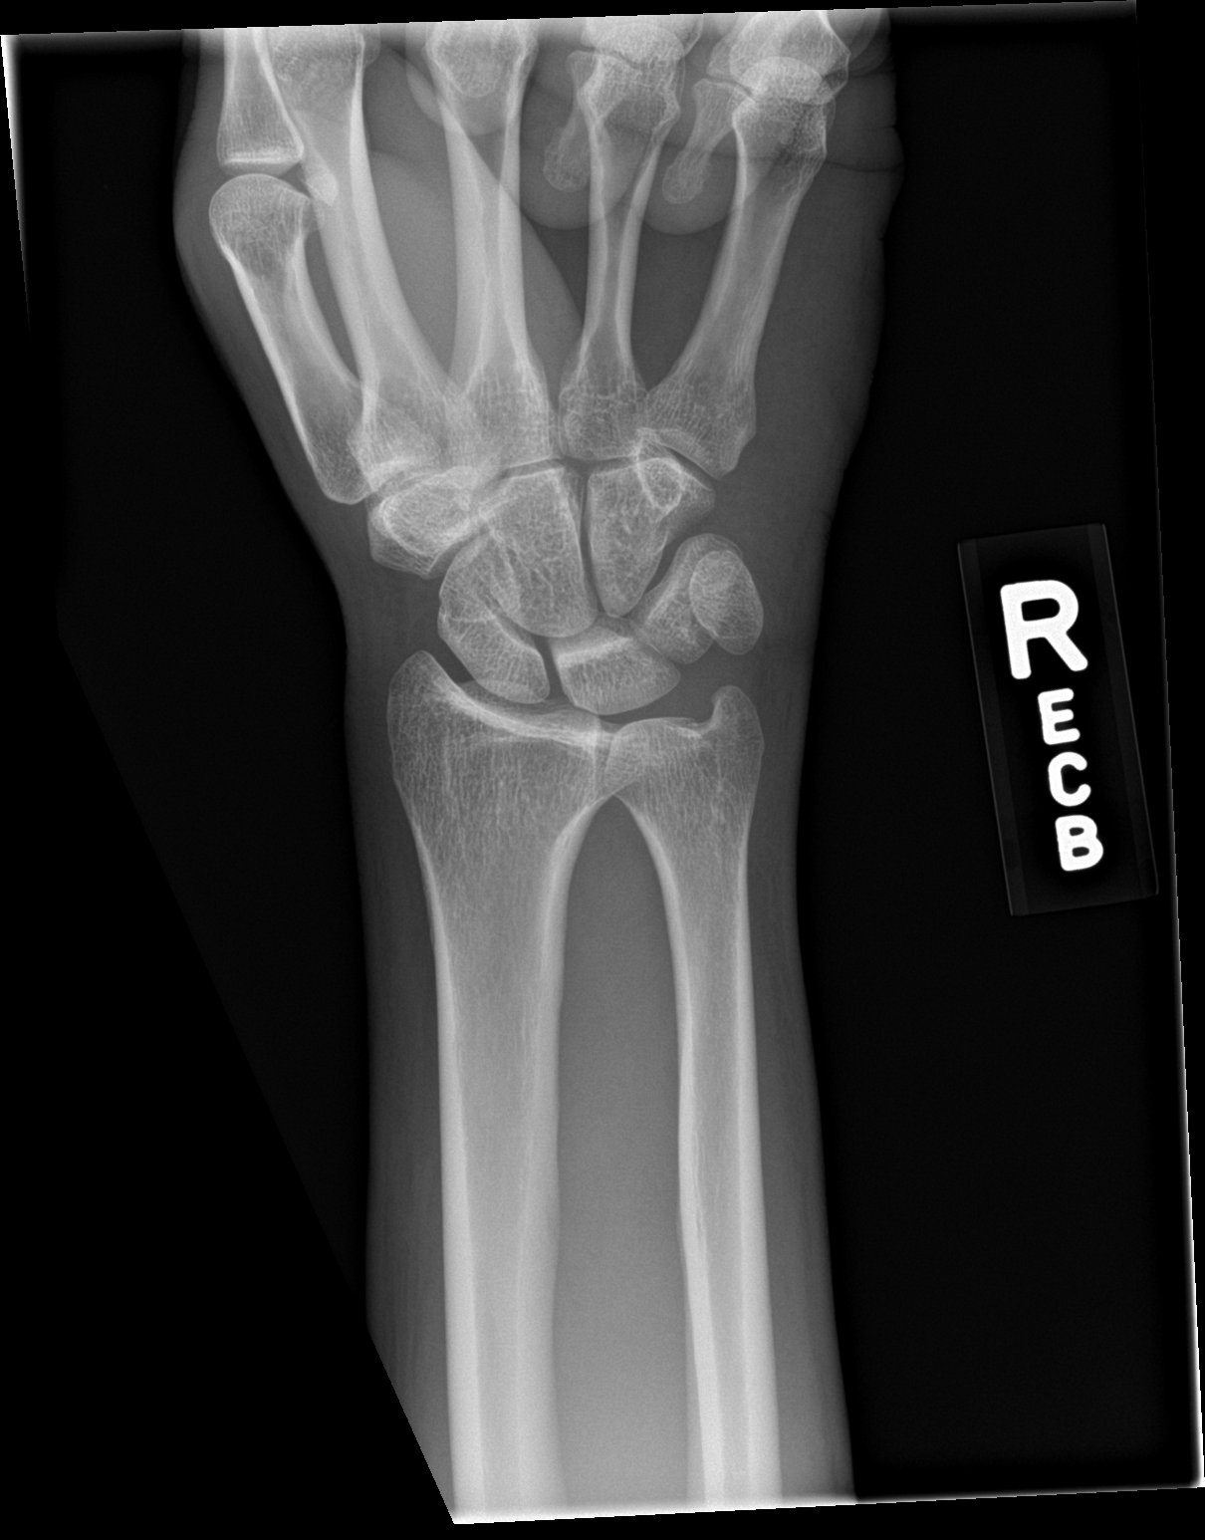

[wrist obl (1 of 2)]
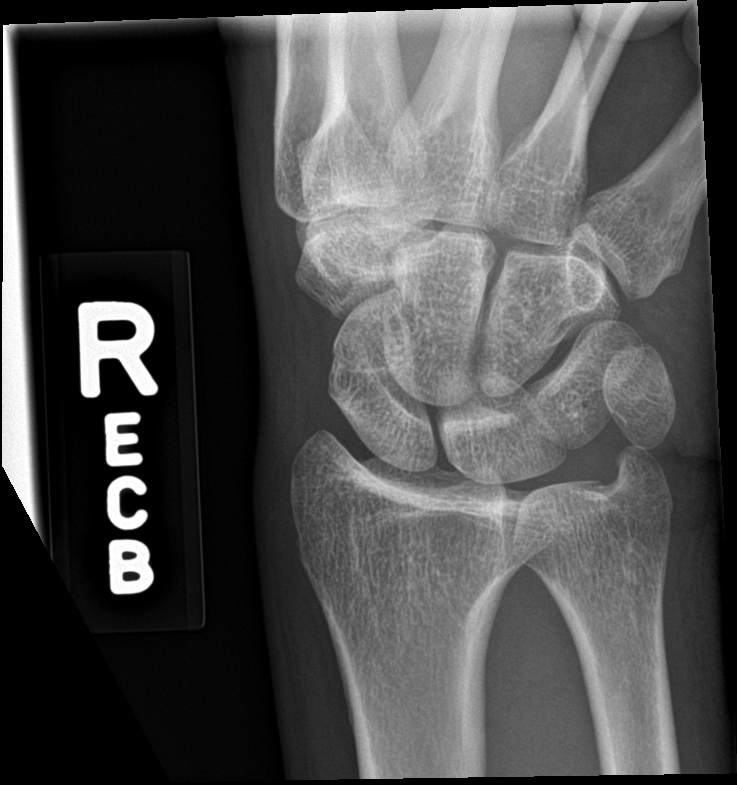

[wrist lat]
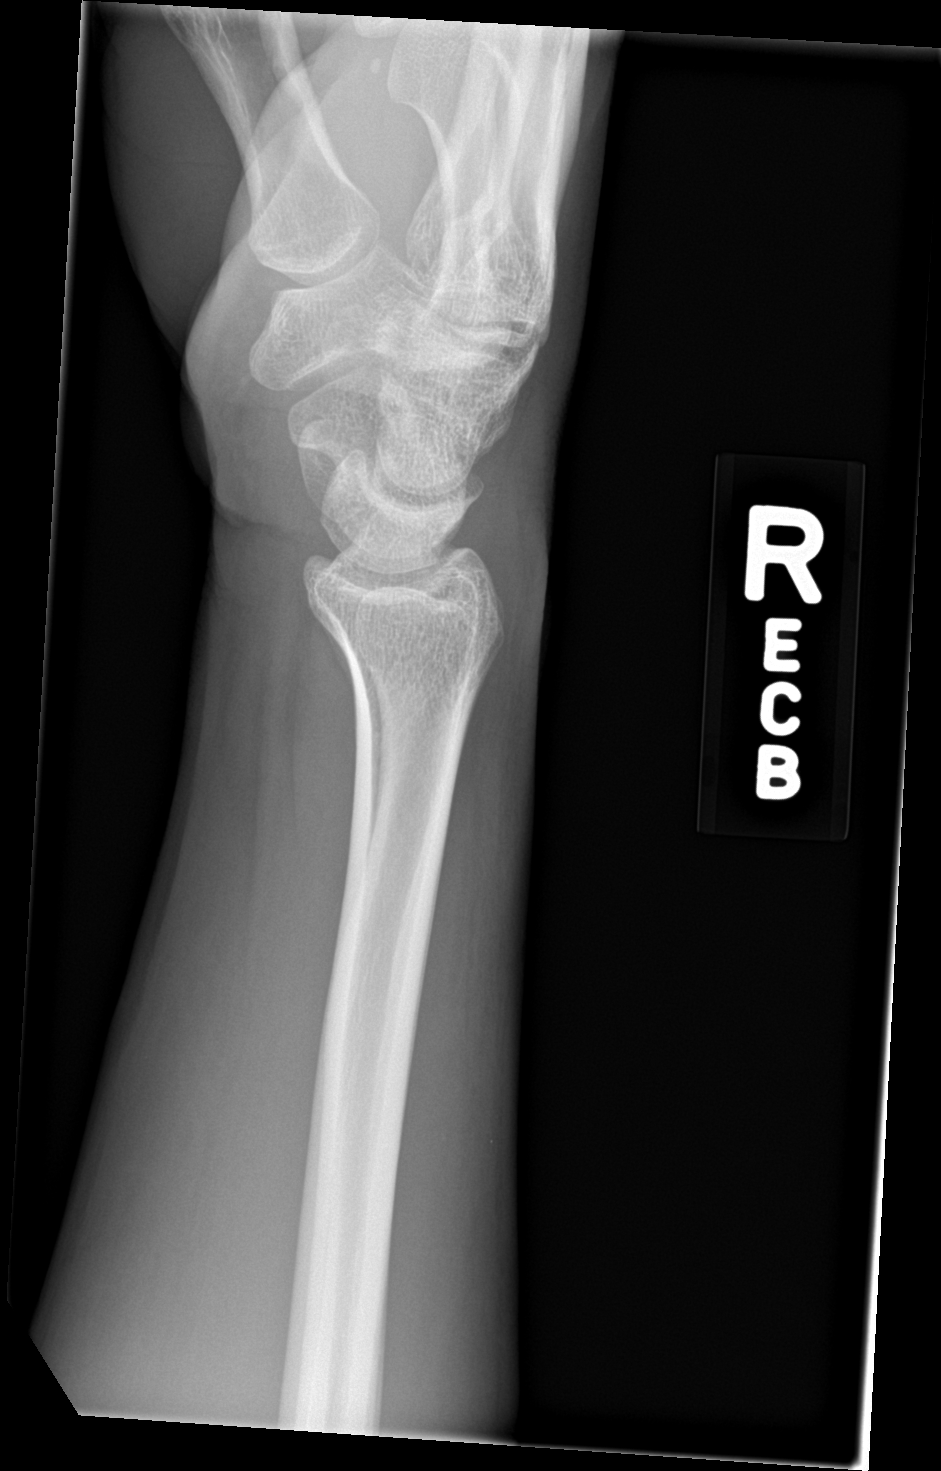

[wrist obl (2 of 2)]
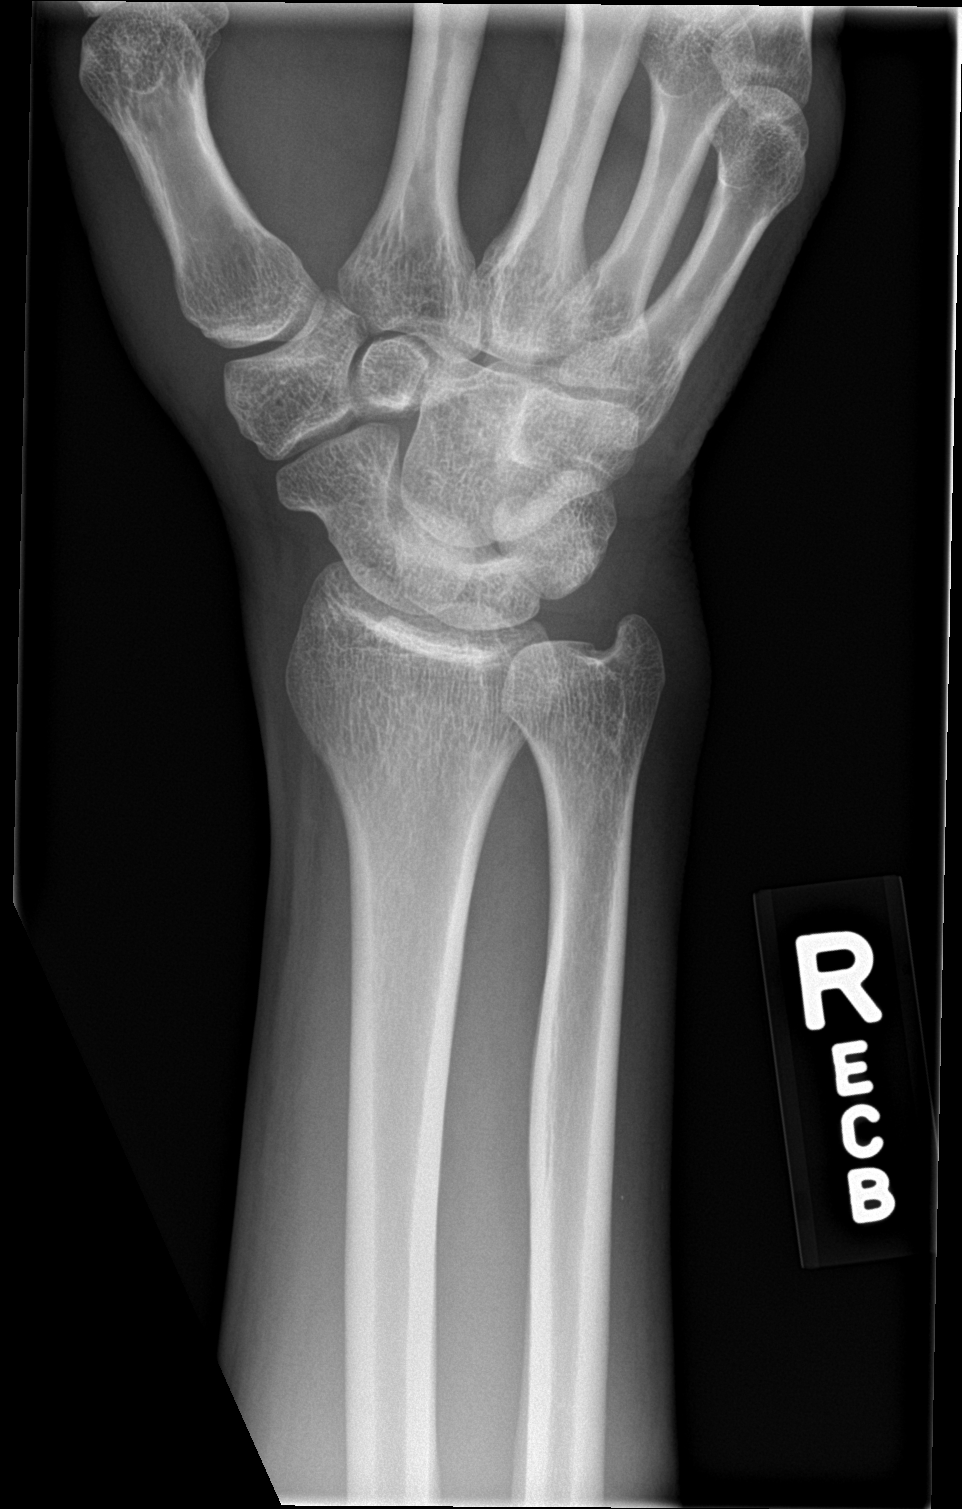

[4 of 4 positions shown; findings below may reference images not displayed]

FINDINGS: Frontal, oblique, lateral, and ulnar deviation scaphoid images were
obtained. There is no fracture or dislocation. Joint spaces appear
normal. No erosive change. No radiopaque foreign body or soft tissue
air evident.
IMPRESSION: No fracture or dislocation. No arthropathy. No soft tissue air or
radiopaque foreign body.

## 2020-06-30 ENCOUNTER — Ambulatory Visit: Payer: Medicaid Other

## 2020-08-31 ENCOUNTER — Ambulatory Visit: Payer: Medicaid Other

## 2020-09-30 ENCOUNTER — Encounter: Payer: Self-pay | Admitting: Emergency Medicine

## 2020-09-30 ENCOUNTER — Other Ambulatory Visit: Payer: Self-pay

## 2020-09-30 ENCOUNTER — Emergency Department
Admission: EM | Admit: 2020-09-30 | Discharge: 2020-09-30 | Discharge status: Against Medical Advice | Payer: Medicaid Other | Attending: Emergency Medicine | Admitting: Emergency Medicine

## 2020-09-30 DIAGNOSIS — F1110 Opioid abuse, uncomplicated: Secondary | ICD-10-CM

## 2020-09-30 DIAGNOSIS — F1990 Other psychoactive substance use, unspecified, uncomplicated: Secondary | ICD-10-CM

## 2020-09-30 DIAGNOSIS — F17210 Nicotine dependence, cigarettes, uncomplicated: Secondary | ICD-10-CM | POA: Insufficient documentation

## 2020-09-30 HISTORY — DX: Other psychoactive substance use, unspecified, uncomplicated: F19.90

## 2020-09-30 LAB — CBC WITH DIFFERENTIAL/PLATELET
Abs Immature Granulocytes: 0.03 10*3/uL (ref 0.00–0.07)
Basophils Absolute: 0 10*3/uL (ref 0.0–0.1)
Basophils Relative: 0 %
Eosinophils Absolute: 0 10*3/uL (ref 0.0–0.5)
Eosinophils Relative: 0 %
HCT: 41.9 % (ref 39.0–52.0)
Hemoglobin: 14.2 g/dL (ref 13.0–17.0)
Immature Granulocytes: 0 %
Lymphocytes Relative: 13 %
Lymphs Abs: 1.3 10*3/uL (ref 0.7–4.0)
MCH: 28.7 pg (ref 26.0–34.0)
MCHC: 33.9 g/dL (ref 30.0–36.0)
MCV: 84.8 fL (ref 80.0–100.0)
Monocytes Absolute: 0.3 10*3/uL (ref 0.1–1.0)
Monocytes Relative: 3 %
Neutro Abs: 8 10*3/uL — ABNORMAL HIGH (ref 1.7–7.7)
Neutrophils Relative %: 84 %
Platelets: 286 10*3/uL (ref 150–400)
RBC: 4.94 MIL/uL (ref 4.22–5.81)
RDW: 12.9 % (ref 11.5–15.5)
WBC: 9.6 10*3/uL (ref 4.0–10.5)
nRBC: 0 % (ref 0.0–0.2)

## 2020-09-30 LAB — COMPREHENSIVE METABOLIC PANEL
ALT: 26 U/L (ref 0–44)
AST: 23 U/L (ref 15–41)
Albumin: 3.7 g/dL (ref 3.5–5.0)
Alkaline Phosphatase: 54 U/L (ref 38–126)
Anion gap: 11 (ref 5–15)
BUN: 10 mg/dL (ref 6–20)
CO2: 22 mmol/L (ref 22–32)
Calcium: 8.7 mg/dL — ABNORMAL LOW (ref 8.9–10.3)
Chloride: 104 mmol/L (ref 98–111)
Creatinine, Ser: 0.73 mg/dL (ref 0.61–1.24)
GFR, Estimated: >60 mL/min (ref >60)
Glucose, Bld: 102 mg/dL — ABNORMAL HIGH (ref 70–99)
Potassium: 3.7 mmol/L (ref 3.5–5.1)
Sodium: 137 mmol/L (ref 135–145)
Total Bilirubin: 0.6 mg/dL (ref 0.3–1.2)
Total Protein: 7.4 g/dL (ref 6.5–8.1)

## 2020-09-30 LAB — SALICYLATE LEVEL: Salicylate Lvl: <7 mg/dL — ABNORMAL LOW (ref 7.0–30.0)

## 2020-09-30 LAB — ACETAMINOPHEN LEVEL: Acetaminophen (Tylenol), Serum: <10 ug/mL — ABNORMAL LOW (ref 10–30)

## 2020-09-30 LAB — ETHANOL: Alcohol, Ethyl (B): <10 mg/dL (ref <10)

## 2020-09-30 NOTE — ED Notes (Signed)
Pt refusing to stand at this time. Initially refused treatment and requested to leave but agreed with Dr. Katrinka Blazing to give blood and EKG at this time. Unable to obtain urine sample at this time due to pt not cooperating. Blood sent to lab for analysis. Pt Sleeping at this time. Reports last heroin usage today. Reports injects heroin. Denies ETOH usage. Reports currently homeless.

## 2020-09-30 NOTE — ED Provider Notes (Signed)
[facility] Emergency Department Provider Note ____________________________________________   Event Date/Time   First MD Initiated Contact with Patient 09/30/20 1307     (approximate)  I have reviewed the triage vital signs and the nursing notes.  HISTORY  Chief Complaint Drug Problem   HPI Gregory Collier is a 45 y.o. malewho presents to the ED for evaluation of heroin detox  Chart review indicates history of IVDU and polysubstance abuse.  Patient does to the ED voluntarily requesting detox from heroin.  He reports using intravenous heroin regularly, as recently as yesterday.  Denies coingestants such as ethanol, cocaine or methamphetamines.  He denies suicidality, homicidality or hallucinations.  Further requesting treatment for gonorrhea, but reports he has not been tested.  We discussed testing prior to treatment and he is agreeable.  He denies dysuria or penile discharge.  Denies penile, inguinal or scrotal lesions.  Denies recent illnesses, assault, syncope, chest pain   Past Medical History:  Diagnosis Date  . Hepatitis C   . IV drug user     Patient Active Problem List   Diagnosis Date Noted  . Septic arthritis (HCC) 02/02/2016    Past Surgical History:  Procedure Laterality Date  . ABDOMINAL SURGERY    . HERNIA REPAIR      Prior to Admission medications   Medication Sig Start Date End Date Taking? Authorizing Provider  carisoprodol (SOMA) 350 MG tablet Take 1 tablet (350 mg total) by mouth 3 (three) times daily as needed for muscle spasms. Patient not taking: Reported on 02/02/2016 04/23/15   Myrna Blazer, MD  ibuprofen (ADVIL,MOTRIN) 600 MG tablet Take 1 tablet (600 mg total) by mouth every 6 (six) hours as needed. 10/11/17   Dionne Bucy, MD  meloxicam (MOBIC) 15 MG tablet Take 1 tablet (15 mg total) by mouth daily. Patient not taking: Reported on 10/11/2017 01/17/16   Bethel Born, PA-C  mupirocin  ointment Idelle Jo) 2 % Apply to affected area 3 times daily 01/15/20 01/14/21  Emily Filbert, MD  naloxone HCl 4 MG/0.1ML LIQD Apply to both nostrils if having decreased breathing after heroin use. Patient not taking: Reported on 10/11/2017 02/27/16   Willy Eddy, MD  oxyCODONE 10 MG TABS Take 1 tablet (10 mg total) by mouth every 4 (four) hours as needed for moderate pain or breakthrough pain. Patient not taking: Reported on 10/11/2017 02/03/16   Auburn Bilberry, MD    Allergies Patient has no known allergies.  No family history on file.  Social History Social History   Tobacco Use  . Smoking status: Current Every Day Smoker    Packs/day: 3.00    Years: 0.00    Pack years: 0.00    Types: Cigarettes  . Smokeless tobacco: Never Used  Substance Use Topics  . Alcohol use: No  . Drug use: Yes    Types: Heroin    Comment: HEROIN ADDICTION    Review of Systems  Constitutional: No fever/chills Eyes: No visual changes. ENT: No sore throat. Cardiovascular: Denies chest pain. Respiratory: Denies shortness of breath. Gastrointestinal: No abdominal pain.  No nausea, no vomiting.  No diarrhea.  No constipation. Genitourinary: Negative for dysuria. Musculoskeletal: Negative for back pain. Skin: Negative for rash. Neurological: Negative for headaches, focal weakness or numbness.  ____________________________________________   PHYSICAL EXAM:  VITAL SIGNS: Vitals:   09/30/20 1401  BP: 110/83  Pulse: 82  Resp: 14  Temp: 98.6 F (37 C)  SpO2: 98%  Constitutional: Alert and oriented. Well appearing and in no acute distress.  Difficult to get him to participate with examination.  Hiding beneath blankets.  Dirty bilateral hands without evidence of laceration or trauma. Eyes: Conjunctivae are normal. PERRL. EOMI. Head: Atraumatic. Nose: No congestion/rhinnorhea. Mouth/Throat: Mucous membranes are moist.  Oropharynx non-erythematous. Neck: No stridor. No cervical  spine tenderness to palpation. Cardiovascular: Normal rate, regular rhythm. Grossly normal heart sounds.  Good peripheral circulation. Respiratory: Normal respiratory effort.  No retractions. Lungs CTAB. Gastrointestinal: Soft , nondistended, nontender to palpation. No CVA tenderness. Musculoskeletal: No lower extremity tenderness nor edema.  No joint effusions. No signs of acute trauma. Neurologic:  Normal speech and language. No gross focal neurologic deficits are appreciated. No gait instability noted. Skin:  Skin is warm, dry and intact. No rash noted. Psychiatric: Mood and affect are normal. Speech and behavior are normal.  ____________________________________________   LABS (all labs ordered are listed, but only abnormal results are displayed)  Labs Reviewed  COMPREHENSIVE METABOLIC PANEL - Abnormal; Notable for the following components:      Result Value   Glucose, Bld 102 (*)    Calcium 8.7 (*)    All other components within normal limits  CBC WITH DIFFERENTIAL/PLATELET - Abnormal; Notable for the following components:   Neutro Abs 8.0 (*)    All other components within normal limits  SALICYLATE LEVEL - Abnormal; Notable for the following components:   Salicylate Lvl <7.0 (*)    All other components within normal limits  ACETAMINOPHEN LEVEL - Abnormal; Notable for the following components:   Acetaminophen (Tylenol), Serum <10 (*)    All other components within normal limits  CHLAMYDIA/NGC RT PCR ([facility] ONLY)  ETHANOL  URINE DRUG SCREEN, QUALITATIVE ([facility] ONLY)  URINALYSIS, COMPLETE ([facility]) WITH MICROSCOPIC   ____________________________________________  12 Lead EKG  Sinus rhythm, rate of 77 bpm.  Normal axis and intervals.  No evidence of acute ischemia. ________________   MDM / ED COURSE   45 year old male with history of polysubstance abuse presents to the ED voluntarily requesting detox from opiates, but eloping prior to psychiatric and social work  evaluation.  Normal vitals on room air.  Exam without evidence of acute derangements.  He is difficult to get to participate in examination, but I see no evidence of acute pathology.  He is ambulatory with a normal gait without evidence of neurologic or vascular deficits.  I see no evidence of trauma or distress.  He has no signs or symptoms of psychiatric emergency to necessitate IVC.  He ultimately elopes voluntarily without giving a reason.  I then discussed the case with his brother, who calls in, requesting that I IVC the patient.  I educated the brother that I see no indication for IVC and I discussed with brother [facility] for possible outpatient resources.  We discussed return precautions for the ED  Clinical Course as of 09/30/20 1519  Sat Sep 30, 2020  1452 Patient ambulates out of the ED on his own accord.  Reports he is not willing to wait and be evaluated.  Nursing was on the phone with his brother at this time.  I take the phone to discuss patient's care with his brother.  Brother reports concern for patient's safety as he seemed to continue to use opiates recklessly.  Brother reports if we can IVC the patient.  I educate the brother that while I agree that cessation from parenteral opiates would be helpful for the patient, I cannot force  this upon him.  I see no indication for IVC as patient declined suicidal intent to me and I see no evidence of emergent psychiatric pathology to necessitate IVC.  I educated brother of [facility] services as a Buyer, retail that he may use for his brother.  We discussed the process of IVC that he could undergo if he had concerns for his brother's safety. [DS]    Clinical Course User Index [DS] Delton Prairie, MD    ____________________________________________   FINAL CLINICAL IMPRESSION(S) / ED DIAGNOSES  Final diagnoses:  Opiate abuse, continuous (HCC)  IVDU (intravenous drug user)     ED Discharge Orders    None       Song Myre  Katrinka Blazing   Note:  This document was prepared using Dragon voice recognition software and may include unintentional dictation errors.   Delton Prairie, MD 09/30/20 [phone]

## 2020-09-30 NOTE — ED Triage Notes (Signed)
Pt wants detox from heroin, last used a little bit today.

## 2020-09-30 NOTE — ED Notes (Signed)
Pt left ED via ambulatory. Did not give reason to why left. Brother called MD to IVC however pt denies SI/HI at this time. Pt ambulated out of department without assistance.

## 2021-06-20 ENCOUNTER — Emergency Department
Admit: 2021-06-20 | Discharge: 2021-06-20 | Disposition: A | Discharge status: Home / Self Care | Payer: MEDICAID | Attending: Emergency Medicine

## 2021-06-20 ENCOUNTER — Ambulatory Visit
Admit: 2021-06-20 | Discharge: 2021-06-20 | Disposition: A | Discharge status: Home / Self Care | Payer: MEDICAID | Attending: Emergency Medicine

## 2021-06-20 DIAGNOSIS — R202 Paresthesia of skin: Principal | ICD-10-CM

## 2021-06-20 DIAGNOSIS — Z711 Person with feared health complaint in whom no diagnosis is made: Principal | ICD-10-CM

## 2021-06-20 MED ORDER — PREDNISONE 20 MG TABLET
ORAL_TABLET | Freq: Every day | ORAL | 0 refills | 5 days | Status: CP
Start: 2021-06-20 — End: 2021-06-25

## 2021-08-15 ENCOUNTER — Emergency Department
Admission: EM | Admit: 2021-08-15 | Discharge: 2021-08-15 | Disposition: A | Discharge status: Home / Self Care | Payer: Medicaid Other | Attending: Emergency Medicine | Admitting: Emergency Medicine

## 2021-08-15 ENCOUNTER — Other Ambulatory Visit: Payer: Self-pay

## 2021-08-15 DIAGNOSIS — Z5321 Procedure and treatment not carried out due to patient leaving prior to being seen by health care provider: Secondary | ICD-10-CM | POA: Insufficient documentation

## 2021-08-15 DIAGNOSIS — T40411A Poisoning by fentanyl or fentanyl analogs, accidental (unintentional), initial encounter: Secondary | ICD-10-CM | POA: Insufficient documentation

## 2021-08-15 NOTE — ED Triage Notes (Signed)
Pt comes into the ED via [facility] c/o drug overdose on 30cc Fentanyl.  Pt had agonal breathing with fire arrival.  They gave 8mg  narcan IN.   98% RA 120 HR 124/85

## 2021-08-15 NOTE — ED Triage Notes (Addendum)
Patient to ER via [facility]. Patient reports using IV heroin he believes could have be laced with fentanyl, reports he "doesn't know what was in the bag". Reports long term heroin user, but has been clean 83 days until today.  EMS administered 8mg  intranasal narcan.

## 2021-09-05 ENCOUNTER — Ambulatory Visit: Admit: 2021-09-05 | Discharge: 2021-09-05 | Disposition: A | Discharge status: Home / Self Care

## 2021-09-05 DIAGNOSIS — S0502XA Injury of conjunctiva and corneal abrasion without foreign body, left eye, initial encounter: Principal | ICD-10-CM

## 2021-09-05 MED ORDER — ERYTHROMYCIN 5 MG/GRAM (0.5 %) EYE OINTMENT
0 refills | 0 days | Status: CP
Start: 2021-09-05 — End: ?

## 2021-12-01 ENCOUNTER — Emergency Department
Admit: 2021-12-01 | Discharge: 2021-12-01 | Disposition: A | Discharge status: Home / Self Care | Payer: MEDICAID | Attending: Emergency Medicine

## 2021-12-01 ENCOUNTER — Ambulatory Visit
Admit: 2021-12-01 | Discharge: 2021-12-01 | Disposition: A | Discharge status: Home / Self Care | Payer: MEDICAID | Attending: Emergency Medicine

## 2021-12-01 DIAGNOSIS — S0502XA Injury of conjunctiva and corneal abrasion without foreign body, left eye, initial encounter: Principal | ICD-10-CM

## 2021-12-01 DIAGNOSIS — B1920 Unspecified viral hepatitis C without hepatic coma: Principal | ICD-10-CM

## 2021-12-01 DIAGNOSIS — T1592XA Foreign body on external eye, part unspecified, left eye, initial encounter: Principal | ICD-10-CM

## 2021-12-01 MED ORDER — CEPHALEXIN 500 MG CAPSULE
ORAL_CAPSULE | Freq: Four times a day (QID) | ORAL | 0 refills | 10 days | Status: CP
Start: 2021-12-01 — End: 2021-12-11

## 2022-01-26 ENCOUNTER — Ambulatory Visit
Admit: 2022-01-26 | Discharge: 2022-01-26 | Disposition: A | Discharge status: Home / Self Care | Payer: MEDICAID | Attending: Student in an Organized Health Care Education/Training Program

## 2022-01-26 DIAGNOSIS — T148XXA Other injury of unspecified body region, initial encounter: Principal | ICD-10-CM

## 2022-01-26 DIAGNOSIS — S81812D Laceration without foreign body, left lower leg, subsequent encounter: Principal | ICD-10-CM

## 2022-01-26 DIAGNOSIS — Z4802 Encounter for removal of sutures: Principal | ICD-10-CM

## 2022-01-26 DIAGNOSIS — L089 Local infection of the skin and subcutaneous tissue, unspecified: Principal | ICD-10-CM

## 2022-01-26 MED ORDER — CEPHALEXIN 500 MG CAPSULE
ORAL_CAPSULE | Freq: Four times a day (QID) | ORAL | 0 refills | 10 days | Status: CP
Start: 2022-01-26 — End: 2022-02-05
  Filled 2022-01-26: qty 40, 10d supply, fill #0

## 2022-02-27 ENCOUNTER — Ambulatory Visit
Admit: 2022-02-27 | Payer: MEDICAID | Attending: Student in an Organized Health Care Education/Training Program | Primary: Student in an Organized Health Care Education/Training Program

## 2022-03-14 ENCOUNTER — Ambulatory Visit: Admit: 2022-03-14 | Discharge: 2022-03-15

## 2022-03-14 DIAGNOSIS — B182 Chronic viral hepatitis C: Principal | ICD-10-CM

## 2022-03-14 DIAGNOSIS — B1920 Unspecified viral hepatitis C without hepatic coma: Principal | ICD-10-CM

## 2022-03-15 DIAGNOSIS — B182 Chronic viral hepatitis C: Principal | ICD-10-CM

## 2022-03-15 MED ORDER — GLECAPREVIR 100 MG-PIBRENTASVIR 40 MG TABLET
Freq: Every day | ORAL | 1 refills | 28 days | Status: CP
Start: 2022-03-15 — End: ?

## 2022-04-18 NOTE — Unmapped (Signed)
Hepatology Clinic Pharmacist Note    Primary Hepatology provider: Annie Sable, NP  Diagnosis: Chronic Hep C  Fibrosis: F0 (5 kPa) on 03/14/22  Planned regimen: Mavyret x 8 wks    Reached out to patient to ask about paperwork but could not leave voicemail.    Reached out to patient again about paperwork. He reported that he completed the letter but that he couldn't find the paperwork in his email to complete. Resent paperwork for patient to complete. He confirmed that he would send it back today.     Tomma Rakers, PharmD Candidate    Park Breed, Pharm D., BCPS, BCGP, CPP  [facility]  [address]  [place], [place] [address]  [phone]

## 2022-04-18 NOTE — Unmapped (Signed)
[facility] Specialty Medication Onboarding    Specialty Medication: MAVYRET 100-40 mg tablet (glecaprevir-pibrentasvir )  Prior Authorization: Not Required   Financial Assistance: No - MAPs has reached maximum number of attempts to obtain necessary information from the patient in regards to the financial assistance process  Final Copay/Day Supply: cash price / 28    Insurance Restrictions: None     Notes to Pharmacist:     The triage team has completed the benefits investigation and has determined that the patient is able to fill this medication at [facility]. Please contact the patient to complete the onboarding or follow up with the prescribing physician as needed.

## 2022-04-26 NOTE — Unmapped (Signed)
[facility] - Enhanced Care Program  Reason for Call: Medication Access; Type: Manufacturer Assistance Program  Referral Request: Hepatology - Park Breed, CPP    Summary of Telephone Encounter  ??? MAP team has made multiple attempts to reach pt to obtain signature waiver form and proof of income for requirements for [facility] assistance application for Mavyret.   ??? I called pt at phone # [phone] but I could not leave a VM because mailbox has not been set up.     Follow-Up:  ?? Continue to reach out to pt to assist obtaining documents needed for [facility] assistance for Mavyret.     Call Attempt #: 1  Time Spent on Referral: 5 minutes  Number of Days Spent on Referral: 1      Aldean Ast, [facility]   Pharmacy Department  [facility]  [address]   [place], [place] [address]  [phone]

## 2022-05-03 NOTE — Unmapped (Signed)
[facility] - Enhanced Care Program  Reason for Call: Medication Access; Type: Manufacturer Assistance Program  Referral Request: Hepatology - Park Breed, CPP    Summary of Telephone Encounter  ??? MAP team has made multiple attempts to reach pt to obtain signature waiver form and proof of income for requirements for [facility] assistance application for Mavyret.   ??? I made another call attempt to reach pt at phone # [phone].  I could not leave a VM because mailbox has not been set up.   ??? Pt has not set up MY CHART for messages.  Follow-Up:  ?? Continue to reach out to pt to assist obtaining documents needed for [facility] assistance for Mavyret.     Call Attempt #: 2  Time Spent on Referral: 10 minutes  Number of Days Spent on Referral: 2    Vertell Limber RN, BSN  [facility]ursing [facility]   Pharmacy Department  [facility]  [address]   [place], [place] [address]  [phone]

## 2022-05-10 NOTE — Unmapped (Signed)
[facility] - Enhanced Care Program  Reason for Call: Medication Access; Type: Manufacturer Assistance Program  Referral Request: Hepatology - Park Breed, CPP    Summary of Telephone Encounter  ??? MAP team has made multiple attempts to reach pt to obtain signature waiver form and proof of income for requirements for [facility] assistance application for Mavyret.   ??? I made another call attempt to reach pt at phone # [phone].  I could not leave a VM because mailbox has not been set up.   ??? Pt has not set up MY CHART for messages.  Follow-Up:  ?? Continue to reach out to pt to assist obtaining documents needed for [facility] assistance for Mavyret.     Call Attempt #: 3  Time Spent on Referral: 15 minutes  Number of Days Spent on Referral: 3    Vertell Limber RN, [facility]   Pharmacy Department  [facility]  [address]   [place], [place] [address]  [phone]

## 2022-05-24 NOTE — Unmapped (Signed)
[facility] - Enhanced Care Program  Reason for Call: Medication Access; Type: Manufacturer Assistance Program  Referral Request: Hepatology - Park Breed, CPP    Summary of Telephone Encounter  ??? MAP team has made multiple attempts to reach pt to obtain signature waiver form and proof of income for requirements for [facility] assistance application for Mavyret.   ??? I made another call attempt to reach pt at phone # [phone].  I could not leave a VM because mailbox has not been set up.   ??? Pt has not set up MY CHART for messages.  Follow-Up:  ?? Continue to reach out to pt to assist obtaining documents needed for [facility] assistance for Mavyret.     Call Attempt #: 4  Time Spent on Referral: 20 minutes  Number of Days Spent on Referral: 4    Vertell Limber RN, [facility]   Pharmacy Department  [facility]  [address]   [place], [place] [address]  [phone]

## 2022-06-05 NOTE — Unmapped (Signed)
Specialty Medication(s): Mavyret 100-40mg     [name] has been dis-enrolled from the [facility] specialty pharmacy services due to multiple unsuccessful outreach attempts by the pharmacy MAPS technician to obtain needed information to seek manufacturer's assistance. He can be re-enrolled in the future if needed.    Additional information provided to the patient: n/a    [name], PharmD  [facility] Specialty Pharmacist

## 2023-09-12 ENCOUNTER — Emergency Department: Admit: 2023-09-12 | Discharge: 2023-09-12 | Disposition: A | Discharge status: Home / Self Care | Payer: MEDICAID

## 2023-09-12 DIAGNOSIS — S39012A Strain of muscle, fascia and tendon of lower back, initial encounter: Principal | ICD-10-CM

## 2023-09-12 MED ORDER — CARISOPRODOL 250 MG TABLET
ORAL_TABLET | Freq: Three times a day (TID) | ORAL | 0 refills | 5 days | Status: CP
Start: 2023-09-12 — End: 2023-09-12

## 2023-09-12 MED ORDER — IBUPROFEN 600 MG TABLET
ORAL_TABLET | Freq: Four times a day (QID) | ORAL | 0 refills | 8 days | Status: CP | PRN
Start: 2023-09-12 — End: ?

## 2023-09-12 MED ORDER — CARISOPRODOL 350 MG TABLET
ORAL_TABLET | Freq: Three times a day (TID) | ORAL | 0 refills | 5 days | Status: CP
Start: 2023-09-12 — End: 2023-09-17

## 2023-09-12 MED FILL — CARISOPRODOL 350 MG TABLET: ORAL | 5 days supply | Qty: 15 | Fill #0

## 2023-09-12 MED FILL — IBUPROFEN 600 MG TABLET: ORAL | 8 days supply | Qty: 30 | Fill #0

## 2023-10-30 ENCOUNTER — Ambulatory Visit: Admit: 2023-10-30 | Discharge: 2023-10-31 | Payer: Medicaid (Managed Care)

## 2023-10-30 DIAGNOSIS — R002 Palpitations: Principal | ICD-10-CM

## 2023-10-30 DIAGNOSIS — R0609 Other forms of dyspnea: Principal | ICD-10-CM

## 2024-01-12 DIAGNOSIS — B370 Candidal stomatitis: Principal | ICD-10-CM

## 2024-01-12 MED ORDER — CLOTRIMAZOLE 10 MG TROCHE
ORAL_TABLET | 2 refills | 0.00000 days | Status: CP
Start: 2024-01-12 — End: ?

## 2024-07-23 ENCOUNTER — Emergency Department
Admit: 2024-07-23 | Discharge: 2024-07-24 | Disposition: A | Discharge status: Home / Self Care | Payer: Medicaid (Managed Care)

## 2024-07-23 DIAGNOSIS — T1592XA Foreign body on external eye, part unspecified, left eye, initial encounter: Principal | ICD-10-CM

## 2024-07-23 MED ORDER — MOXIFLOXACIN 0.5 % EYE DROPS
Freq: Four times a day (QID) | OPHTHALMIC | 0 refills | 15.00000 days | Status: CP
Start: 2024-07-23 — End: ?

## 2024-07-23 MED ORDER — ERYTHROMYCIN 5 MG/GRAM (0.5 %) EYE OINTMENT
0 refills | 0.00000 days | Status: CP
Start: 2024-07-23 — End: ?
